# Patient Record
Sex: Female | Born: 1980 | Race: White | Hispanic: No | Marital: Single | Smoking: Never smoker
Health system: Southern US, Community
[De-identification: ages and names within clinical notes are randomized; demographics above are authoritative.]

## PROBLEM LIST (undated history)

## (undated) HISTORY — PX: WISDOM TOOTH EXTRACTION: SHX21

## (undated) HISTORY — PX: LITHOTRIPSY: SUR834

---

## 2003-12-29 ENCOUNTER — Other Ambulatory Visit: Admission: RE | Admit: 2003-12-29 | Discharge: 2003-12-29 | Payer: Self-pay | Admitting: Obstetrics & Gynecology

## 2004-04-25 ENCOUNTER — Ambulatory Visit: Payer: Self-pay | Admitting: Internal Medicine

## 2004-08-14 ENCOUNTER — Ambulatory Visit: Payer: Self-pay | Admitting: Internal Medicine

## 2004-10-15 ENCOUNTER — Ambulatory Visit (HOSPITAL_COMMUNITY): Admission: RE | Admit: 2004-10-15 | Discharge: 2004-10-15 | Payer: Self-pay | Admitting: Urology

## 2004-12-04 ENCOUNTER — Ambulatory Visit: Payer: Self-pay | Admitting: Internal Medicine

## 2004-12-25 ENCOUNTER — Ambulatory Visit: Payer: Self-pay | Admitting: Internal Medicine

## 2005-01-23 ENCOUNTER — Other Ambulatory Visit: Admission: RE | Admit: 2005-01-23 | Discharge: 2005-01-23 | Payer: Self-pay | Admitting: Obstetrics & Gynecology

## 2005-03-25 ENCOUNTER — Ambulatory Visit: Payer: Self-pay | Admitting: Internal Medicine

## 2005-07-12 ENCOUNTER — Ambulatory Visit: Payer: Self-pay | Admitting: Internal Medicine

## 2005-12-03 ENCOUNTER — Ambulatory Visit: Payer: Self-pay | Admitting: Internal Medicine

## 2006-02-04 ENCOUNTER — Ambulatory Visit: Payer: Self-pay | Admitting: Internal Medicine

## 2006-03-25 ENCOUNTER — Ambulatory Visit: Payer: Self-pay | Admitting: Internal Medicine

## 2006-08-06 ENCOUNTER — Ambulatory Visit: Payer: Self-pay | Admitting: Internal Medicine

## 2006-12-01 DIAGNOSIS — J4520 Mild intermittent asthma, uncomplicated: Secondary | ICD-10-CM | POA: Insufficient documentation

## 2006-12-01 DIAGNOSIS — J309 Allergic rhinitis, unspecified: Secondary | ICD-10-CM | POA: Insufficient documentation

## 2007-03-17 ENCOUNTER — Ambulatory Visit: Payer: Self-pay | Admitting: Internal Medicine

## 2007-04-08 ENCOUNTER — Ambulatory Visit: Payer: Self-pay | Admitting: Internal Medicine

## 2007-11-02 ENCOUNTER — Ambulatory Visit: Payer: Self-pay | Admitting: Internal Medicine

## 2008-05-04 ENCOUNTER — Ambulatory Visit: Payer: Self-pay | Admitting: Internal Medicine

## 2008-05-31 ENCOUNTER — Ambulatory Visit: Payer: Self-pay | Admitting: Internal Medicine

## 2008-06-06 ENCOUNTER — Telehealth (INDEPENDENT_AMBULATORY_CARE_PROVIDER_SITE_OTHER): Payer: Self-pay | Admitting: *Deleted

## 2009-03-09 ENCOUNTER — Ambulatory Visit: Payer: Self-pay | Admitting: Internal Medicine

## 2009-05-29 ENCOUNTER — Ambulatory Visit: Payer: Self-pay | Admitting: Internal Medicine

## 2010-03-20 NOTE — Assessment & Plan Note (Signed)
Summary: 1 year return/mh   Primary Provider/Referring Provider:  Alysia Penna  CC:  Yearly follow up visit-doing great.Marland Kitchen  History of Present Illness: 03/17/07-f Present Illness: Allergic rhinitis Asthma On allergy vaccine 1:10 ( W-E) but misses/ delays occ on trips with work with no effect. We discussed stopping vaccine and length of time off vaccine for observation Denies ears popping, nasal discharge, wheeze or cough. Discussed otc antihistamines. Diiscussed environmental precautions.  05/31/08- Allergic rhinitis, asthma Had no problems this winter. This is the first spring without  problems and she feels very well now. Can't remember the last time she needed her rescue inhaler. Not needing an antihistamine. She decided to continue allergy vaccine last year.   May 29, 2009- Allergic rhinitis, asthma Another good year. She hasn't used her rescue inhaler at alll in the past year. She has continued allergy vaccine but we talked about coming off now. Discussed available antihistamines if needed. She continues Singulair, and Advair once daily.     Current Medications (verified): 1)  Advair Diskus 100-50 Mcg/dose  Misc (Fluticasone-Salmeterol) .... Use Every Am 2)  Singulair 10 Mg  Tabs (Montelukast Sodium) .... Take 1 Tablet By Mouth Once A Day 3)  Allergy Vaccine Go 1:10 (W-E) 4)  Epipen 2-Pak 0.3 Mg/0.70ml (1:1000)  Devi (Epinephrine Hcl (Anaphylaxis)) .... For Severe Allergic Reaction 5)  Proair Hfa 108 (90 Base) Mcg/act Aers (Albuterol Sulfate) .... 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   Risk Factors: Smoking Status: never (03/17/2007)  Review of Systems      See HPI  The patient denies anorexia, fever, weight loss, weight gain, vision loss, decreased hearing, hoarseness, chest pain, syncope, dyspnea on exertion, peripheral edema, prolonged cough, headaches, hemoptysis, and severe indigestion/heartburn.    Vital Signs:  Patient profile:   30 year old female Height:      69 inches Weight:      288 pounds BMI:     42.68 O2 Sat:      97 % on Room air Pulse rate:   81 / minute BP sitting:   122 / 80  (left arm) Cuff size:   large  Vitals Entered By: Reynaldo Minium CMA (May 29, 2009 4:06 PM)  O2 Flow:  Room air  Physical Exam  Additional Exam:  General: A/Ox3; pleasant and cooperative, NAD,  remains significantly overweight SKIN: no rash, lesions NODES: no lymphadenopathy HEENT: Tropic/AT, EOM- WNL, Conjuctivae- clear, PERRLA, TM-WNL, Nose- clear, Throat- clear and wnl, Mallampati III-IV, tonsils present NECK: Supple w/ fair ROM, JVD- none, normal carotid impulses w/o bruits Thyroid- normal to palpation, no stridor CHEST: Clear to P&A HEART: RRR, no m/g/r heard ABDOMEN:  EAV:WUJW, nl pulses, no edema  NEURO: Grossly intact to observation      Impression & Recommendations:  Problem # 1:  ASTHMA (ICD-493.90) She has done very well on maintenance meds which we are renewing.  Problem # 2:  ALLERGIC RHINITIS (ICD-477.9)  She has been extrenmely well controlled over past few years. OTC antihistamines are available. We will stop allergy vaccine now.  Other Orders: Est. Patient Level III (11914)  Patient Instructions:  1)  Please schedule a follow-up appointment in 1 year. 2)  Scripts for refill meds sent to your drug store 3)  We are stopping allergy vaccine.  4)  Consider an otc antihistamine like claritin/ loratadine if needed for itching/ sneezing Prescriptions: PROAIR HFA 108 (90 BASE)  MCG/ACT AERS (ALBUTEROL SULFATE) 2 puffs four times a day as needed rescue inhaler  #1 x prn   Entered and Authorized by:   Waymon Budge MD   Signed by:   Waymon Budge MD on 05/29/2009   Method used:   Electronically to        [id number] (retail)       [address]       [place], [place]  [address]       Ph: [phone] or [phone]       Fax: [phone]   RxID:   [id number] SINGULAIR 10 MG  TABS (MONTELUKAST SODIUM) Take 1 tablet by mouth once a day  #30 x prn   Entered and Authorized by:   Waymon Budge MD   Signed by:   Waymon Budge MD on 05/29/2009   Method used:   Electronically to        [id number] (retail)       [address]       [place], [place]  [address]       Ph: [phone] or [phone]       Fax: [phone]   RxID:   [id number] ADVAIR DISKUS 100-50 MCG/DOSE  MISC (FLUTICASONE-SALMETEROL) use every am  #60 Each x prn   Entered and Authorized by:   Waymon Budge MD   Signed by:   Waymon Budge MD on 05/29/2009   Method used:   Electronically to        [id number] (retail)       [address]       [place], [place]  [address]       Ph: [phone] or [phone]       Fax: [phone]   RxID:   [id number]

## 2010-05-23 ENCOUNTER — Encounter: Payer: Self-pay | Admitting: Internal Medicine

## 2010-05-28 ENCOUNTER — Ambulatory Visit: Payer: Self-pay | Admitting: Internal Medicine

## 2010-07-06 NOTE — Assessment & Plan Note (Signed)
[facility]                             PULMONARY OFFICE NOTE   DENNICE, TINDOL                     MRN:          [id number]  DATE:02/04/2006                            DOB:          1980-05-24    PROBLEM:  1. Allergic rhinitis  2. Asthma.  3. Exogenous obesity.   HISTORY:  One-year follow-up.  She says it has been a good fall and  overall the best year in a long time for her rhinitis complaints.  A  neighbor gives her allergy shots at 1-10, well-tolerated.  We took time  again today to discuss risks, benefit, and goals of allergy vaccine,  including issues of administration outside the medical office,  anaphylaxis, and epinephrine, and I refilled an Epi-Pen with discussion.   MEDICATIONS:  1. Advair 100/50.  2. Birth control pills.  3. Allergy vaccine.  4. Singulair.  5. Astelin nasal spray.  6. Rescue albuterol inhaler.   ALLERGIES:  No medication allergy.   OBJECTIVE:  VITAL SIGNS:  Weight exceeds our scale limit.  Blood  pressure 128/82, pulse regular and 113, room air saturation 98%.  GENERAL:  Alert.  HEENT:  Conjunctivae not injected.  Nasal mucosa looks normal.  LUNGS:  Clear.  HEART:  Heart sounds regular without murmur.   IMPRESSION:  1. Allergic rhinitis.  2. Allergic asthma under good control.   PLAN:  We refilled Advair 100/50, albuterol HFA, and Allegra 180 mg,  Astelin, and Epi-Pen.  Schedule return one year, earlier p.r.n.     Clinton D. Maple Hudson, MD, Tonny Bollman, FACP  Electronically Signed    CDY/MedQ  DD: 02/05/2006  DT: 02/05/2006  Job #: [id number]

## 2010-07-09 ENCOUNTER — Ambulatory Visit: Payer: Self-pay | Admitting: Internal Medicine

## 2010-08-20 ENCOUNTER — Encounter: Payer: Self-pay | Admitting: Internal Medicine

## 2010-08-20 ENCOUNTER — Ambulatory Visit (INDEPENDENT_AMBULATORY_CARE_PROVIDER_SITE_OTHER): Payer: BC Managed Care – PPO | Admitting: Internal Medicine

## 2010-08-20 VITALS — BP 140/86 | HR 84 | Ht 69.0 in | Wt 312.0 lb

## 2010-08-20 DIAGNOSIS — J309 Allergic rhinitis, unspecified: Secondary | ICD-10-CM

## 2010-08-20 DIAGNOSIS — J45909 Unspecified asthma, uncomplicated: Secondary | ICD-10-CM

## 2010-08-20 MED ORDER — FLUTICASONE-SALMETEROL 100-50 MCG/DOSE IN AEPB: 1.0000 | INHALATION_SPRAY | Freq: Two times a day (BID) | RESPIRATORY_TRACT | Status: DC

## 2010-08-20 MED ORDER — ALBUTEROL SULFATE HFA 108 (90 BASE) MCG/ACT IN AERS: 2.0000 | INHALATION_SPRAY | Freq: Four times a day (QID) | RESPIRATORY_TRACT | Status: DC | PRN

## 2010-08-20 NOTE — Assessment & Plan Note (Signed)
Sudden onset nasal congestion with sneeze, lasting a day or so, sounds more like a vasomotor rhinitis. I will give a sample of Patanase since I have it here. Consider trying ipratropium.

## 2010-08-20 NOTE — Progress Notes (Signed)
  Subjective:    Patient ID: Rebecca Irwin, female    DOB: 10-21-1980, 30 y.o.   MRN: [id number]  HPI 08/20/10- 16 yoF never smoker followed for asthma, allergic rhinitis Last here May 29, 2009 Cites one day or so per month, especially in Spring, when she starts active sneezing. An ice pack will calm this. No particular exposure. She stoppd allergy shots after 5 years, 2 years ago. She believes they stopped her asthma. She has been taking Advair once daily and begins to cough more if she skips it. Stopped Singulair- little benefit. . Takes zyrtec. Never refilled rescue inhalers. A nose spray tried in the past had an unpleasant metallic taste = Astelin?- she calls it flonase..    Review of Systems Constitutional:   No weight loss, night sweats,  Fevers, chills, fatigue, lassitude. HEENT:   No headaches,  Difficulty swallowing,  Tooth/dental problems,  Sore throat,                CV:  No chest pain,  Orthopnea, PND, swelling in lower extremities, anasarca, dizziness, palpitations  GI  No heartburn, indigestion, abdominal pain, nausea, vomiting, diarrhea, change in bowel habits, loss of appetite  Resp: No shortness of breath with exertion or at rest.  No excess mucus, no productive cough,  No non-productive cough,  No coughing up of blood.  No change in color of mucus.  No recent wheezing.   Skin: no rash or lesions.  GU: no dysuria, change in color of urine, no urgency or frequency.  No flank pain.  MS:  No joint pain or swelling.  No decreased range of motion.  No back pain.  Psych:  No change in mood or affect. No depression or anxiety.  No memory loss.     Objective:   Physical Exam General- Alert, Oriented, Affect-appropriate, Distress- none acute    obese  Skin- rash-none, lesions- none, excoriation- none  Lymphadenopathy- none  Head- atraumatic  Eyes- Gross vision intact, PERRLA, conjunctivae clear secretions  Ears- Hearing, canals, Tm- normal  Nose- Clear, No-Septal  dev,   Some- mucus bridging, No- polyps, erosion, perforation   Throat- Mallampati II-III , mucosa clear , drainage- none, tonsils- moderately large, not occlusive  Neck- flexible , trachea midline, no stridor , thyroid nl, carotid no bruit  Chest - symmetrical excursion , unlabored     Heart/CV- RRR , no murmur , no gallop  , no rub, nl s1 s2                     - JVD- none , edema- none, stasis changes- none, varices- none     Lung- clear to P&A, wheeze- none, cough- none , dullness-none, rub- none     Chest wall-   Abd- tender-no, distended-no, bowel sounds-present, HSM- no  Br/ Gen/ Rectal- Not done, not indicated  Extrem- cyanosis- none, clubbing, none, atrophy- none, strength- nl  Neuro- grossly intact to observation         Assessment & Plan:

## 2010-08-20 NOTE — Patient Instructions (Signed)
Script Advair refilled- sent  Script Proair rescue inhaler printed  Singulair dc'd  Sample Patanase nasal antihistamine spray      1-2 puffs each nostril, up to twice daily as needed

## 2010-08-20 NOTE — Assessment & Plan Note (Addendum)
Good control.  Will remove Singulair from med list, refill Advair and rescue inhaler

## 2011-08-19 ENCOUNTER — Ambulatory Visit: Payer: BC Managed Care – PPO | Admitting: Internal Medicine

## 2011-10-14 ENCOUNTER — Ambulatory Visit: Payer: BC Managed Care – PPO | Admitting: Internal Medicine

## 2011-12-10 ENCOUNTER — Ambulatory Visit (INDEPENDENT_AMBULATORY_CARE_PROVIDER_SITE_OTHER): Payer: BC Managed Care – PPO | Admitting: Internal Medicine

## 2011-12-10 ENCOUNTER — Encounter: Payer: Self-pay | Admitting: Internal Medicine

## 2011-12-10 VITALS — BP 128/86 | HR 82 | Ht 69.0 in | Wt 341.2 lb

## 2011-12-10 DIAGNOSIS — J4520 Mild intermittent asthma, uncomplicated: Secondary | ICD-10-CM

## 2011-12-10 DIAGNOSIS — J45909 Unspecified asthma, uncomplicated: Secondary | ICD-10-CM

## 2011-12-10 MED ORDER — FLUTICASONE-SALMETEROL 100-50 MCG/DOSE IN AEPB: INHALATION_SPRAY | RESPIRATORY_TRACT | Status: DC

## 2011-12-10 MED ORDER — ALBUTEROL SULFATE HFA 108 (90 BASE) MCG/ACT IN AERS: 2.0000 | INHALATION_SPRAY | Freq: Four times a day (QID) | RESPIRATORY_TRACT | Status: AC | PRN

## 2011-12-10 NOTE — Progress Notes (Signed)
Subjective:    Patient ID: Rebecca Irwin, female    DOB: 03/04/1980, 31 y.o.   MRN: [id number]  HPI 08/20/10- 28 yoF never smoker followed for asthma, allergic rhinitis Last here May 29, 2009 Cites one day or so per month, especially in Spring, when she starts active sneezing. An ice pack will calm this. No particular exposure. She stoppd allergy shots after 5 years, 2 years ago. She believes they stopped her asthma. She has been taking Advair once daily and begins to cough more if she skips it. Stopped Singulair- little benefit. . Takes zyrtec. Never refilled rescue inhalers. A nose spray tried in the past had an unpleasant metallic taste = Astelin?- she calls it flonase..   12/10/11- 30 yoF never smoker followed for asthma, allergic rhinitis  Pt states that she has been doing well since last OV with occassional allergy flare. Pt c/o little nasal congestion.  Patient plans to get Flu vax from work Uses Advair irregularly- discussed. Cough in cold air.  Occasional rhinitis- uses zyrtec.   ROS-see HPI Constitutional:   No-   weight loss, night sweats, fevers, chills, fatigue, lassitude. HEENT:   No-  headaches, difficulty swallowing, tooth/dental problems, sore throat,       +occasional  sneezing, itching, ear ache, nasal congestion, post nasal drip,  CV:  No-   chest pain, orthopnea, PND, swelling in lower extremities, anasarca, dizziness, palpitations Resp: No-   shortness of breath with exertion or at rest.              No-   productive cough,  No non-productive cough,  No- coughing up of blood.              No-   change in color of mucus.  No- wheezing.   Skin: No-   rash or lesions. GI:  No-   heartburn, indigestion, abdominal pain, nausea, vomiting,  GU:  MS:  No-   joint pain or swelling.  . Neuro-     nothing unusual Psych:  No- change in mood or affect. No depression or anxiety.  No memory loss.  Objective:   Physical Exam OBJ- Physical Exam General- Alert, Oriented,  Affect-appropriate, Distress- none acute, overweight Skin- rash-none, lesions- none, excoriation- none Lymphadenopathy- none Head- atraumatic            Eyes- Gross vision intact, PERRLA, conjunctivae and secretions clear            Ears- Hearing, canals-normal            Nose- Clear, no-Septal dev, mucus, polyps, erosion, perforation             Throat- Mallampati III , mucosa clear , drainage- none, +tonsils-large, nonocclusive Neck- flexible , trachea midline, no stridor , thyroid nl, carotid no bruit Chest - symmetrical excursion , unlabored           Heart/CV- RRR , no murmur , no gallop  , no rub, nl s1 s2                           - JVD- none , edema- none, stasis changes- none, varices- none           Lung- clear to P&A, wheeze- none, cough- none , dullness-none, rub- none           Chest wall-  Abd-  Br/ Gen/ Rectal- Not done, not indicated Extrem- cyanosis- none, clubbing, none, atrophy- none, strength-  nl Neuro- grossly intact to observation  Assessment & Plan:

## 2011-12-10 NOTE — Patient Instructions (Addendum)
Script for rescue inhaler to carry with you for occasional use as needed  Script for Advair "maintenance inhaler" for use every day during times of need

## 2011-12-20 NOTE — Assessment & Plan Note (Signed)
Has been using Advair as a rescue inhaler. Educated on meds. Plan- Refill Advair 100. Add rescue inhaler

## 2012-12-10 ENCOUNTER — Ambulatory Visit: Payer: BC Managed Care – PPO | Admitting: Internal Medicine

## 2013-03-05 ENCOUNTER — Ambulatory Visit: Payer: BC Managed Care – PPO | Admitting: Internal Medicine

## 2013-04-15 ENCOUNTER — Ambulatory Visit: Payer: BC Managed Care – PPO | Admitting: Internal Medicine

## 2013-05-14 ENCOUNTER — Ambulatory Visit: Payer: BC Managed Care – PPO | Admitting: Internal Medicine

## 2013-06-14 ENCOUNTER — Encounter: Payer: Self-pay | Admitting: Internal Medicine

## 2013-06-14 ENCOUNTER — Ambulatory Visit (INDEPENDENT_AMBULATORY_CARE_PROVIDER_SITE_OTHER): Payer: BC Managed Care – PPO | Admitting: Internal Medicine

## 2013-06-14 VITALS — BP 130/88 | HR 68 | Ht 69.0 in | Wt 341.0 lb

## 2013-06-14 DIAGNOSIS — J45909 Unspecified asthma, uncomplicated: Secondary | ICD-10-CM

## 2013-06-14 DIAGNOSIS — J309 Allergic rhinitis, unspecified: Secondary | ICD-10-CM

## 2013-06-14 DIAGNOSIS — J4520 Mild intermittent asthma, uncomplicated: Secondary | ICD-10-CM

## 2013-06-14 MED ORDER — FLUTICASONE-SALMETEROL 100-50 MCG/DOSE IN AEPB: INHALATION_SPRAY | RESPIRATORY_TRACT | Status: DC

## 2013-06-14 MED ORDER — ALBUTEROL SULFATE HFA 108 (90 BASE) MCG/ACT IN AERS: 2.0000 | INHALATION_SPRAY | Freq: Four times a day (QID) | RESPIRATORY_TRACT | Status: DC | PRN

## 2013-06-14 NOTE — Patient Instructions (Signed)
Refills scripts sent  Please call as needed

## 2013-06-14 NOTE — Progress Notes (Signed)
Subjective:    Patient ID: Rebecca HammingKristin K Irwin, female    DOB: 1980-05-11, 33 y.o.   MRN: [id number]  HPI 08/20/10- [age] yoF never smoker followed for asthma, allergic rhinitis Last here May 29, 2009 Cites one day or so per month, especially in Spring, when she starts active sneezing. An ice pack will calm this. No particular exposure. She stoppd allergy shots after 5 years, 2 years ago. She believes they stopped her asthma. She has been taking Advair once daily and begins to cough more if she skips it. Stopped Singulair- little benefit. . Takes zyrtec. Never refilled rescue inhalers. A nose spray tried in the past had an unpleasant metallic taste = Astelin?- she calls it flonase..   12/10/11- 30 yoF never smoker followed for asthma, allergic rhinitis  Pt states that she has been doing well since last OV with occassional allergy flare. Pt c/o little nasal congestion.  Patient plans to get Flu vax from work Uses Advair irregularly- discussed. Cough in cold air.  Occasional rhinitis- uses zyrtec.   06/15/13- 33 yoF never smoker followed for asthma, allergic rhinitis FOLLOWS FOR:  Sinus and breathing doing well Some nasal congestion with tree pollen season but now doing better. Used Advair for a few weeks. Has moved into in a brand-new house. Working at night. Wheezing stopped when she replaced brand-new mattress with an older one.  ROS-see HPI Constitutional:   No-   weight loss, night sweats, fevers, chills, fatigue, lassitude. HEENT:   No-  headaches, difficulty swallowing, tooth/dental problems, sore throat,       +occasional  sneezing, itching, ear ache, nasal congestion, post nasal drip,  CV:  No-   chest pain, orthopnea, PND, swelling in lower extremities, anasarca, dizziness, palpitations Resp: No-   shortness of breath with exertion or at rest.              No-   productive cough,  No non-productive cough,  No- coughing up of blood.              No-   change in color of mucus.  No-  wheezing.   Skin: No-   rash or lesions. GI:  No-   heartburn, indigestion, abdominal pain, nausea, vomiting,  GU:  MS:  No-   joint pain or swelling.  . Neuro-     nothing unusual Psych:  No- change in mood or affect. No depression or anxiety.  No memory loss.  Objective:   Physical Exam OBJ- Physical Exam General- Alert, Oriented, Affect-appropriate, Distress- none acute, overweight Skin- rash-none, lesions- none, excoriation- none Lymphadenopathy- none Head- atraumatic            Eyes- Gross vision intact, PERRLA, conjunctivae and secretions clear            Ears- Hearing, canals-normal            Nose- Clear, no-Septal dev, mucus, polyps, erosion, perforation             Throat- Mallampati III , mucosa clear , drainage- none, +tonsils-large, nonocclusive Neck- flexible , trachea midline, no stridor , thyroid nl, carotid no bruit Chest - symmetrical excursion , unlabored           Heart/CV- RRR , no murmur , no gallop  , no rub, nl s1 s2                           - JVD- none , edema- none,  stasis changes- none, varices- none           Lung- clear to P&A, wheeze- none, cough- none , dullness-none, rub- none           Chest wall-  Abd-  Br/ Gen/ Rectal- Not done, not indicated Extrem- cyanosis- none, clubbing, none, atrophy- none, strength- nl Neuro- grossly intact to observation  Assessment & Plan:

## 2013-07-09 NOTE — Assessment & Plan Note (Signed)
Recent flare attributed to tree pollens, now resolved

## 2013-07-09 NOTE — Assessment & Plan Note (Signed)
Mild exacerbation apparently related to a new mattress, resolved by returning to an older one

## 2014-06-13 ENCOUNTER — Ambulatory Visit: Payer: BC Managed Care – PPO | Admitting: Internal Medicine

## 2015-09-21 DIAGNOSIS — K219 Gastro-esophageal reflux disease without esophagitis: Secondary | ICD-10-CM | POA: Insufficient documentation

## 2018-01-22 ENCOUNTER — Ambulatory Visit: Payer: BC Managed Care – PPO | Admitting: Family Medicine

## 2018-01-22 ENCOUNTER — Encounter: Payer: Self-pay | Admitting: Family Medicine

## 2018-01-22 ENCOUNTER — Ambulatory Visit (INDEPENDENT_AMBULATORY_CARE_PROVIDER_SITE_OTHER): Payer: BC Managed Care – PPO

## 2018-01-22 VITALS — BP 150/100 | HR 101 | Temp 98.1°F | Ht 69.0 in | Wt 368.4 lb

## 2018-01-22 DIAGNOSIS — R058 Other specified cough: Secondary | ICD-10-CM | POA: Insufficient documentation

## 2018-01-22 DIAGNOSIS — I10 Essential (primary) hypertension: Secondary | ICD-10-CM

## 2018-01-22 DIAGNOSIS — R0683 Snoring: Secondary | ICD-10-CM | POA: Diagnosis not present

## 2018-01-22 DIAGNOSIS — R5383 Other fatigue: Secondary | ICD-10-CM

## 2018-01-22 DIAGNOSIS — R059 Cough, unspecified: Secondary | ICD-10-CM

## 2018-01-22 DIAGNOSIS — R0789 Other chest pain: Secondary | ICD-10-CM | POA: Diagnosis not present

## 2018-01-22 DIAGNOSIS — R05 Cough: Secondary | ICD-10-CM | POA: Diagnosis not present

## 2018-01-22 LAB — CBC
HEMATOCRIT: 40.5 % (ref 36.0–46.0)
Hemoglobin: 13.2 g/dL (ref 12.0–15.0)
MCHC: 32.6 g/dL (ref 30.0–36.0)
MCV: 90.5 fl (ref 78.0–100.0)
Platelets: 256 10*3/uL (ref 150.0–400.0)
RBC: 4.47 Mil/uL (ref 3.87–5.11)
RDW: 13.6 % (ref 11.5–15.5)
WBC: 6.3 10*3/uL (ref 4.0–10.5)

## 2018-01-22 LAB — BASIC METABOLIC PANEL
BUN: 17 mg/dL (ref 6–23)
CALCIUM: 8.9 mg/dL (ref 8.4–10.5)
CHLORIDE: 106 meq/L (ref 96–112)
CO2: 29 meq/L (ref 19–32)
CREATININE: 0.77 mg/dL (ref 0.40–1.20)
GFR: 88.41 mL/min (ref >60.00)
GLUCOSE: 114 mg/dL — AB (ref 70–99)
Potassium: 4.8 mEq/L (ref 3.5–5.1)
Sodium: 140 mEq/L (ref 135–145)

## 2018-01-22 MED ORDER — PREDNISONE 10 MG (21) PO TBPK: ORAL_TABLET | ORAL | 0 refills | Status: DC

## 2018-01-22 MED ORDER — BENZONATATE 100 MG PO CAPS: 100.0000 mg | ORAL_CAPSULE | Freq: Three times a day (TID) | ORAL | 0 refills | Status: DC | PRN

## 2018-01-22 MED ORDER — CHLORTHALIDONE 25 MG PO TABS: 12.5000 mg | ORAL_TABLET | Freq: Every day | ORAL | 1 refills | Status: DC

## 2018-01-22 NOTE — Progress Notes (Signed)
Rebecca Irwin Primo - 37 y.o. female MRN [id number]  Date of birth: [birth year]  Subjective Chief Complaint  Patient presents with  . Cough    last week oct   . Hypertension    HPI Rebecca Irwin Supple is a 37 y.o. female with history of allergies and asthma here today to establish care with new pcp.  She has concern about ongoing cough.  She reports that symptoms started >1 month ago when she developed cough with wheezing and shortness of breath.  She was seen at urgent care and treated with albuterol and steroid.  She had improvement for about two weeks before symptoms returned.  She has felt associated heaviness in her chest as well.  She hasn't noticed a whole lot of wheezing with this.  She denies fever, chills, pleuritic type chest pain, nausea or vomiting, sinus pain or drainage.    BP also noted to be elevated today.  She has no prior history of HTN and does not take any medication for blood pressure.  She denies any decongestant use.  She does admit to some fatigue but thinks  This may be related to cough.  She does snore pretty heavily with sleep.    ROS:  A comprehensive ROS was completed and negative except as noted per HPI  No Known Allergies  Past Medical History:  Diagnosis Date  . ALLERGIC RHINITIS    skin test positive 2004  . Asthma     Past Surgical History:  Procedure Laterality Date  . LITHOTRIPSY     kidney stones  . WISDOM TOOTH EXTRACTION      Social History   Socioeconomic History  . Marital status: Single    Spouse name: Not on file  . Number of children: Not on file  . Years of education: Not on file  . Highest education level: Not on file  Occupational History  . Occupation: works with Primary school teacherT    Employer: [employer]  Social Needs  . Financial resource strain: Not on file  . Food insecurity:    Worry: Not on file    Inability: Not on file  . Transportation needs:    Medical: Not on file    Non-medical: Not on file  Tobacco Use  . Smoking  status: Never Smoker  . Smokeless tobacco: Never Used  Substance and Sexual Activity  . Alcohol use: Yes    Comment: occass.  . Drug use: Never  . Sexual activity: Not on file  Lifestyle  . Physical activity:    Days per week: Not on file    Minutes per session: Not on file  . Stress: Not on file  Relationships  . Social connections:    Talks on phone: Not on file    Gets together: Not on file    Attends religious service: Not on file    Active member of club or organization: Not on file    Attends meetings of clubs or organizations: Not on file    Relationship status: Not on file  Other Topics Concern  . Not on file  Social History Narrative  . Not on file    Family History  Problem Relation Age of Onset  . Allergies Father        on allergy shots  . Diabetes Maternal Grandmother   . Diabetes Maternal Grandfather   . Cancer Maternal Grandfather   . Pancreatic cancer Maternal Grandfather   . Cancer Paternal Grandmother   . Breast cancer  Paternal Grandmother   . Cancer Paternal Grandfather   . Lung cancer Paternal Grandfather     Health Maintenance  Topic Date Due  . Janet Berlin  06/02/1997  . PAP SMEAR  06/03/1999  . HIV Screening  01/23/2019 (Originally 06/02/1993)  . INFLUENZA VACCINE  02/04/2019 (Originally 09/18/2017)    ----------------------------------------------------------------------------------------------------------------------------------------------------------------------------------------------------------------- Physical Exam BP (!) 150/100   Pulse (!) 101   Temp 98.1 F (36.7 C) (Oral)   Ht 5\' 9"  (1.753 m)   Wt (!) 368 lb 6.4 oz (167.1 kg)   LMP 01/13/2018 (Exact Date)   SpO2 98%   BMI 54.40 kg/m   Physical Exam  Constitutional: She is oriented to person, place, and time. She appears well-nourished. No distress.  HENT:  Head: Normocephalic and atraumatic.  Mouth/Throat: Oropharynx is clear and moist.  Eyes: No scleral icterus.    Neck: Neck supple.  Cardiovascular: Normal rate, regular rhythm and normal heart sounds.  Pulmonary/Chest: Effort normal and breath sounds normal.  Lymphadenopathy:    She has no cervical adenopathy.  Neurological: She is alert and oriented to person, place, and time.  Skin: Skin is warm and dry.  Psychiatric: She has a normal mood and affect. Her behavior is normal.    ------------------------------------------------------------------------------------------------------------------------------------------------------------------------------------------------------------------- Essential hypertension -Start chlorthalidone 12.5mg  -Follow low salt diet -Referral for sleep study.  -F/u 6 weeks.   Cough -Likely post viral cough  -EKG completed today for chest heaviness and normal without ST changes or signs of pericarditis.  -CXR ordered -Repeat prednisone taper.  -Tessalon as needed.  -Recommend humidifier at home.

## 2018-01-22 NOTE — Assessment & Plan Note (Signed)
-  Start chlorthalidone 12.5mg  -Follow low salt diet -Referral for sleep study.  -F/u 6 weeks.

## 2018-01-22 NOTE — Assessment & Plan Note (Addendum)
-  Likely post viral cough  -EKG completed today for chest heaviness and normal without ST changes or signs of pericarditis.  -CXR ordered -Repeat prednisone taper.  -Tessalon as needed.  -Recommend humidifier at home.

## 2018-01-22 NOTE — Patient Instructions (Signed)
We'll be in touch with xray and lab results I have placed a referral for sleep study I will see you back in about 6 weeks.

## 2018-01-23 NOTE — Progress Notes (Signed)
-  CXR looks ok.  Radiologist did note area adjacent to the heart that appears to be a shadow of the heart and is unchanged when compared to imaging in 2010.   -Labs look ok as well.

## 2018-02-05 ENCOUNTER — Encounter: Payer: Self-pay | Admitting: Family Medicine

## 2018-02-05 DIAGNOSIS — R0789 Other chest pain: Secondary | ICD-10-CM

## 2018-02-24 ENCOUNTER — Encounter: Payer: Self-pay | Admitting: Internal Medicine

## 2018-02-24 ENCOUNTER — Ambulatory Visit (INDEPENDENT_AMBULATORY_CARE_PROVIDER_SITE_OTHER): Payer: BC Managed Care – PPO | Admitting: Internal Medicine

## 2018-02-24 VITALS — BP 124/84 | HR 102 | Ht 69.0 in | Wt 361.0 lb

## 2018-02-24 DIAGNOSIS — R05 Cough: Secondary | ICD-10-CM

## 2018-02-24 DIAGNOSIS — R058 Other specified cough: Secondary | ICD-10-CM

## 2018-02-24 MED ORDER — BENZONATATE 200 MG PO CAPS: 200.0000 mg | ORAL_CAPSULE | Freq: Three times a day (TID) | ORAL | 1 refills | Status: DC | PRN

## 2018-02-24 MED ORDER — PREDNISONE 10 MG PO TABS: ORAL_TABLET | ORAL | 0 refills | Status: DC

## 2018-02-24 MED ORDER — FAMOTIDINE 20 MG PO TABS: ORAL_TABLET | ORAL | 11 refills | Status: DC

## 2018-02-24 MED ORDER — PANTOPRAZOLE SODIUM 40 MG PO TBEC: 40.0000 mg | DELAYED_RELEASE_TABLET | Freq: Every day | ORAL | 2 refills | Status: DC

## 2018-02-24 NOTE — Assessment & Plan Note (Signed)
Body mass index is 53.31 kg/m.    No results found for: TSH   Contributing to gerd risk/ doe/reviewed the need and the process to achieve and maintain neg calorie balance > defer f/u primary care including intermittently monitoring thyroid status       Total time devoted to counseling  > 50 % of initial 60 min office visit:  review case with pt/ discussion of options/alternatives/ personally creating written customized instructions  in presence of pt  then going over those specific  Instructions directly with the pt including how to use all of the meds but in particular covering each new medication in detail and the difference between the maintenance= "automatic" meds and the prns using an action plan format for the latter (If this problem/symptom => do that organization reading Left to right).  Please see AVS from this visit for a full list of these instructions which I personally wrote for this pt and  are unique to this visit.

## 2018-02-24 NOTE — Patient Instructions (Signed)
Stop advair   The key to effective treatment for your cough is eliminating the non-stop cycle of cough you're stuck in long enough to let your airway heal completely and then see if there is anything still making you cough once you stop the cough suppression, but this should take no more than 5 days to figure out  For cough take tessalon 200 mg every 6 hours with delsym otc if needed (ok to use both if can't stop)  Prednisone 10 mg take  4 each am x 2 days,   2 each am x 2 days,  1 each am x 2 days and stop (this is to eliminate allergies and inflammation from coughing)  Protonix (pantoprazole) Take 30-60 min before first meal of the day and Pepcid 20 mg one after supper     until cough is completely gone for at least a week without the need for cough suppression  For drainage / throat tickle try take CHLORPHENIRAMINE  4 mg (chlortab 4 mg per [facility])  - take one every 4 hours as needed - available over the counter- may cause drowsiness so start with just a bedtime dose or two and see how you tolerate it before trying in daytime    GERD (REFLUX)  is an extremely common cause of respiratory symptoms, many times with no significant heartburn at all.    It can be treated with medication, but also with lifestyle changes including avoidance of late meals, excessive alcohol, smoking cessation, and avoid fatty foods, chocolate, peppermint, colas, red wine, and acidic juices such as orange juice.  NO MINT OR MENTHOL PRODUCTS SO NO COUGH DROPS   USE HARD CANDY INSTEAD (jolley ranchers or Stover's or Lifesavers (all available in sugarless versions) NO OIL BASED VITAMINS - use powdered substitutes.  If not all better in 2 weeks return for further work up

## 2018-02-24 NOTE — Assessment & Plan Note (Addendum)
Onset Oct 2019 with [facility] rx advair > d/c advair 02/24/2018   The most common causes of chronic cough in immunocompetent adults include the following: upper airway cough syndrome (UACS), previously referred to as postnasal drip syndrome (PNDS), which is caused by variety of rhinosinus conditions; (2) asthma; (3) GERD; (4) chronic bronchitis from cigarette smoking or other inhaled environmental irritants; (5) nonasthmatic eosinophilic bronchitis; and (6) bronchiectasis.   These conditions, singly or in combination, have accounted for up to 94% of the causes of chronic cough in prospective studies.   Other conditions have constituted no >6% of the causes in prospective studies These have included bronchogenic carcinoma, chronic interstitial pneumonia, sarcoidosis, left ventricular failure, ACEI-induced cough, and aspiration from a condition associated with pharyngeal dysfunction.    Chronic cough is often simultaneously caused by more than one condition. A single cause has been found from 38 to 82% of the time, multiple causes from 18 to 62%. Multiply caused cough has been the result of three diseases up to 42% of the time.      Since not having typical allergy symtpoms with this cough or noct cough most likely this is Upper airway cough syndrome (previously labeled PNDS),  is so named because it's frequently impossible to sort out how much is  CR/sinusitis with freq throat clearing (which can be related to primary GERD)   vs  causing  secondary (" extra esophageal")  GERD from wide swings in gastric pressure that occur with throat clearing, often  promoting self use of mint and menthol lozenges that reduce the lower esophageal sphincter tone and exacerbate the problem further in a cyclical fashion.   These are the same pts (now being labeled as having "irritable larynx syndrome" by some cough centers) who not infrequently have a history of having failed to tolerate ace inhibitors,  dry powder inhalers like  advair  or biphosphonates or report having atypical/extraesophageal reflux symptoms that don't respond to standard doses of PPI  and are easily confused as having aecopd or asthma flares by even experienced allergists/ pulmonologists (myself included).    Of the three most common causes of  Sub-acute / recurrent or chronic cough, only one (GERD)  can actually contribute to/ trigger  the other two (asthma and post nasal drip syndrome)  and perpetuate the cylce of cough.  While not intuitively obvious, many patients with chronic low grade reflux do not cough until there is a primary insult that disturbs the protective epithelial barrier and exposes sensitive nerve endings.   This is typically viral but can due to PNDS and  either may apply here.   >>>>    The point is that once this occurs, it is difficult to eliminate the cycle  using anything but a maximally effective acid suppression regimen at least in the short run, accompanied by an appropriate diet to address non acid GERD and control / eliminate the cough itself for at least 3 days with tessalon 200 and delsym and avoid narcotics for now plus 1st gen H1 blockers per guidelines and short course pred for any upper airway T2 driven inflammatory component  If  not better in 2 weeks return with all meds in hand using a trust but verify approach to confirm accurate Medication  Reconciliation The principal here is that until we are certain that the  patients are doing what we've asked, it makes no sense to ask them to do more.

## 2018-02-24 NOTE — Progress Notes (Signed)
Rebecca HammingKristin K Irwin, female    DOB: 01/20/1981,     MRN: [id number]    Brief patient profile:  [id number] yowf never smoker with early in life itchy/sneezing spring > fall  Initially controlled with otcs but then  Onset worse rhinitis assoc cough/wheeze late teens/early twenties so eventually eval by [facility] started  On shots until around 2015 and since then able to control with otc's (benadryl/ no inhalers)  and doing fine until bad sore throat [place] 2019 assoc with severe urge to cough/ clear throat > rx by [facility] with pred/cough med and advair was restarted (p 4 y off ) and did get some short term improvement from 2 cycles of pred but worse off it so referred to pulmonary clinic 02/24/2018 by Everrett Coombeody Matthews.     Brief patient profile:  02/24/2018  Pulmonary/ 1st office eval/[facility]  Cough on advair 100  Chief Complaint  Patient presents with  . Pulmonary Consult    Referred by Everrett Coombeody Matthews, DO.  Pt c/o cough x 3 months- non prod.  She has noticed her cough is triggered by exertion and talking.   Dyspnea:  Only sob if coughing  Cough: no production/ worse as day goes on esp when uses voice  Sleep: fine bed is flat s noct coughing or sob or wheeze SABA use:   None    No obvious other obvious pattern in terms of  day to day or daytime variability or assoc excess/ purulent sputum or mucus plugs or hemoptysis or cp or chest tightness, subjective wheeze or overt sinus or hb symptoms.   Sleeping  without nocturnal  or early am exacerbation  of respiratory  c/o's or need for noct saba. Also denies any obvious fluctuation of symptoms with weather or environmental changes or other aggravating or alleviating factors except as outlined above   No unusual exposure hx or h/o childhood pna  or knowledge of premature birth.  Current Allergies, Complete Past Medical History, Past Surgical History, Family History, and Social History were reviewed in [facility] electronic medical  record.  ROS  The following are not active complaints unless bolded Hoarseness, sore throat, dysphagia, dental problems, itching, sneezing,  nasal congestion or discharge of excess mucus or purulent secretions, ear ache,   fever, chills, sweats, unintended wt loss or wt gain, classically pleuritic or exertional cp,  orthopnea pnd or arm/hand swelling  or leg swelling, presyncope, palpitations, abdominal pain, anorexia, nausea, vomiting, diarrhea  or change in bowel habits or change in bladder habits, change in stools or change in urine, dysuria, hematuria,  rash, arthralgias, visual complaints, headache, numbness, weakness or ataxia or problems with walking or coordination,  change in mood or  memory.          Past Medical History:  Diagnosis Date  . ALLERGIC RHINITIS    skin test positive 2004  . Asthma     Outpatient Medications Prior to Visit  Medication Sig Dispense Refill  . benzonatate (TESSALON) 100 MG capsule Take 1 capsule (100 mg total) by mouth 3 (three) times daily as needed for cough. 30 capsule 0  . chlorthalidone (HYGROTON) 25 MG tablet Take 0.5 tablets (12.5 mg total) by mouth daily. 45 tablet 1  . Fluticasone-Salmeterol (ADVAIR) 100-50 MCG/DOSE AEPB 1 puff twice daily , rinse mouth 60 each prn  . predniSONE (STERAPRED UNI-PAK 21 TAB) 10 MG (21) TBPK tablet Take as directed on packaging 21 tablet 0      Objective:  BP 124/84 (BP Location: Left Arm, Cuff Size: Large)   Pulse (!) 102   Ht 5\' 9"  (1.753 m)   Wt (!) 361 lb (163.7 kg)   SpO2 98%   BMI 53.31 kg/m   SpO2: 98 %  RA  Obese amb wf with voice fatigue   HEENT: nl dentition, turbinates bilaterally, and oropharynx. Nl external ear canals without cough reflex   NECK :  without JVD/Nodes/TM/ nl carotid upstrokes bilaterally   LUNGS: no acc muscle use,  Nl contour chest which is clear to A and P bilaterally with  cough on insp  maneuvers   CV:  RRR  no s3 or murmur or increase in P2, and no edema    ABD:  Quite oberse/ soft and nontender with nl inspiratory excursion in the supine position. No bruits or organomegaly appreciated, bowel sounds nl  MS:  Nl gait/ ext warm without deformities, calf tenderness, cyanosis or clubbing No obvious joint restrictions   SKIN: warm and dry without lesions    NEURO:  alert, approp, nl sensorium with  no motor or cerebellar deficits apparent.      I personally reviewed images and agree with radiology impression as follows:  CXR:   02/01/18 Opacity to the right of the heart is stable since 2010 and of doubtful acute significance. This could represent part of the heart shadow, cardiomegaly. The finding is of doubtful significance given the lack of change since 2010    Assessment   Upper airway cough syndrome Onset Oct 2019 with [facility] rx advair > d/c advair 02/24/2018    The most common causes of chronic cough in immunocompetent adults include the following: upper airway cough syndrome (UACS), previously referred to as postnasal drip syndrome (PNDS), which is caused by variety of rhinosinus conditions; (2) asthma; (3) GERD; (4) chronic bronchitis from cigarette smoking or other inhaled environmental irritants; (5) nonasthmatic eosinophilic bronchitis; and (6) bronchiectasis.   These conditions, singly or in combination, have accounted for up to 94% of the causes of chronic cough in prospective studies.   Other conditions have constituted no >6% of the causes in prospective studies These have included bronchogenic carcinoma, chronic interstitial pneumonia, sarcoidosis, left ventricular failure, ACEI-induced cough, and aspiration from a condition associated with pharyngeal dysfunction.    Chronic cough is often simultaneously caused by more than one condition. A single cause has been found from 38 to 82% of the time, multiple causes from 18 to 62%. Multiply caused cough has been the result of three diseases up to 42% of the time.      Since not  having typical allergy symtpoms with this cough or noct cough most likely this is Upper airway cough syndrome (previously labeled PNDS),  is so named because it's frequently impossible to sort out how much is  CR/sinusitis with freq throat clearing (which can be related to primary GERD)   vs  causing  secondary (" extra esophageal")  GERD from wide swings in gastric pressure that occur with throat clearing, often  promoting self use of mint and menthol lozenges that reduce the lower esophageal sphincter tone and exacerbate the problem further in a cyclical fashion.   These are the same pts (now being labeled as having "irritable larynx syndrome" by some cough centers) who not infrequently have a history of having failed to tolerate ace inhibitors,  dry powder inhalers like advair  or biphosphonates or report having atypical/extraesophageal reflux symptoms that don't respond to standard doses of PPI  and are easily confused as having aecopd or asthma flares by even experienced allergists/ pulmonologists (myself included).    Of the three most common causes of  Sub-acute / recurrent or chronic cough, only one (GERD)  can actually contribute to/ trigger  the other two (asthma and post nasal drip syndrome)  and perpetuate the cylce of cough.  While not intuitively obvious, many patients with chronic low grade reflux do not cough until there is a primary insult that disturbs the protective epithelial barrier and exposes sensitive nerve endings.   This is typically viral but can due to PNDS and  either may apply here.   >>>>    The point is that once this occurs, it is difficult to eliminate the cycle  using anything but a maximally effective acid suppression regimen at least in the short run, accompanied by an appropriate diet to address non acid GERD and control / eliminate the cough itself for at least 3 days with tessalon 200 and delsym and avoid narcotics for now plus 1st gen H1 blockers per guidelines and  short course pred for any upper airway T2 driven inflammatory component  If  not better in 2 weeks return with all meds in hand using a trust but verify approach to confirm accurate Medication  Reconciliation The principal here is that until we are certain that the  patients are doing what we've asked, it makes no sense to ask them to do more.   Morbid obesity due to excess calories (HCC) Body mass index is 53.31 kg/m.     No results found for: TSH   Contributing to gerd risk/ doe/reviewed the need and the process to achieve and maintain neg calorie balance > defer f/u primary care including intermittently monitoring thyroid status         Total time devoted to counseling  > 50 % of initial 60 min office visit:  review case with pt/ discussion of options/alternatives/ personally creating written customized instructions  in presence of pt  then going over those specific  Instructions directly with the pt including how to use all of the meds but in particular covering each new medication in detail and the difference between the maintenance= "automatic" meds and the prns using an action plan format for the latter (If this problem/symptom => do that organization reading Left to right).  Please see AVS from this visit for a full list of these instructions which I personally wrote for this pt and  are unique to this visit.      Sandrea Hughs, MD 02/24/2018

## 2018-03-05 ENCOUNTER — Other Ambulatory Visit: Payer: Self-pay | Admitting: Family Medicine

## 2018-03-05 ENCOUNTER — Ambulatory Visit (INDEPENDENT_AMBULATORY_CARE_PROVIDER_SITE_OTHER): Payer: BC Managed Care – PPO

## 2018-03-05 ENCOUNTER — Ambulatory Visit: Payer: BC Managed Care – PPO | Admitting: Family Medicine

## 2018-03-05 ENCOUNTER — Encounter: Payer: Self-pay | Admitting: Family Medicine

## 2018-03-05 VITALS — BP 134/72 | HR 90 | Temp 98.1°F | Ht 69.0 in | Wt 358.0 lb

## 2018-03-05 DIAGNOSIS — M25562 Pain in left knee: Secondary | ICD-10-CM | POA: Insufficient documentation

## 2018-03-05 DIAGNOSIS — R05 Cough: Secondary | ICD-10-CM | POA: Diagnosis not present

## 2018-03-05 DIAGNOSIS — R5383 Other fatigue: Secondary | ICD-10-CM

## 2018-03-05 DIAGNOSIS — G8929 Other chronic pain: Secondary | ICD-10-CM | POA: Insufficient documentation

## 2018-03-05 DIAGNOSIS — I10 Essential (primary) hypertension: Secondary | ICD-10-CM

## 2018-03-05 DIAGNOSIS — R058 Other specified cough: Secondary | ICD-10-CM

## 2018-03-05 LAB — VITAMIN D 25 HYDROXY (VIT D DEFICIENCY, FRACTURES): VITD: 9.72 ng/mL — ABNORMAL LOW (ref 30.00–100.00)

## 2018-03-05 LAB — BASIC METABOLIC PANEL WITH GFR
BUN: 17 mg/dL (ref 6–23)
CO2: 28 meq/L (ref 19–32)
Calcium: 9 mg/dL (ref 8.4–10.5)
Chloride: 100 meq/L (ref 96–112)
Creatinine, Ser: 0.91 mg/dL (ref 0.40–1.20)
GFR: 72.86 mL/min
Glucose, Bld: 117 mg/dL — ABNORMAL HIGH (ref 70–99)
Potassium: 3.6 meq/L (ref 3.5–5.1)
Sodium: 137 meq/L (ref 135–145)

## 2018-03-05 MED ORDER — VITAMIN D (ERGOCALCIFEROL) 1.25 MG (50000 UNIT) PO CAPS: 50000.0000 [IU] | ORAL_CAPSULE | ORAL | 0 refills | Status: DC

## 2018-03-05 MED ORDER — MELOXICAM 15 MG PO TABS: 15.0000 mg | ORAL_TABLET | Freq: Every day | ORAL | 0 refills | Status: DC

## 2018-03-05 NOTE — Progress Notes (Signed)
Rebecca Irwin - 38 y.o. female MRN [id number]  Date of birth: 1980/10/15  Subjective Chief Complaint  Patient presents with  . Follow-up    tolerating hygroton daily. Being followed by Pulmonology for ongoing cough and SOB    HPI Rebecca Irwin is a 38 y.o. female with history of HTN and chronic cough here today for follow up.    -HTN: Started on chlorthalidone previously, doing well with this. She denies side effects or symptoms of hypotension.  She has been referred for sleep study and has consultation next month.    -Cough:  Recently seen by pulmonology and dx with upper airway cough syndrome.  She is on PPI and H2 blocker and recently completed another course of steroids.  She still has occasional cough but this is improved.  She does report that every time she completes a course of steroids the feeling of tightness across her upper chest returns.  Describes this as a dull, continuous ache.  Tends to be worsened by movement or talking for prolonged periods.   -Knee pain:  L Knee pain off and on for several years.  Reports prior injury during high school.  Noticed improvement after taking prednisone with worsening again after completion.  Denies swelling of the knee or other joint involvement.   ROS:  A comprehensive ROS was completed and negative except as noted per HPI   No Known Allergies  Past Medical History:  Diagnosis Date  . ALLERGIC RHINITIS    skin test positive 2004  . Asthma     Past Surgical History:  Procedure Laterality Date  . LITHOTRIPSY     kidney stones  . WISDOM TOOTH EXTRACTION      Social History   Socioeconomic History  . Marital status: Single    Spouse name: Not on file  . Number of children: Not on file  . Years of education: Not on file  . Highest education level: Not on file  Occupational History  . Occupation: works with Primary school teacher: [employer]  Social Needs  . Financial resource strain: Not on file  . Food  insecurity:    Worry: Not on file    Inability: Not on file  . Transportation needs:    Medical: Not on file    Non-medical: Not on file  Tobacco Use  . Smoking status: Never Smoker  . Smokeless tobacco: Never Used  Substance and Sexual Activity  . Alcohol use: Yes    Comment: occass.  . Drug use: Never  . Sexual activity: Not on file  Lifestyle  . Physical activity:    Days per week: Not on file    Minutes per session: Not on file  . Stress: Not on file  Relationships  . Social connections:    Talks on phone: Not on file    Gets together: Not on file    Attends religious service: Not on file    Active member of club or organization: Not on file    Attends meetings of clubs or organizations: Not on file    Relationship status: Not on file  Other Topics Concern  . Not on file  Social History Narrative  . Not on file    Family History  Problem Relation Age of Onset  . Allergies Father        on allergy shots  . Diabetes Maternal Grandmother   . Diabetes Maternal Grandfather   . Cancer Maternal Grandfather   .  Pancreatic cancer Maternal Grandfather   . Cancer Paternal Grandmother   . Breast cancer Paternal Grandmother   . Cancer Paternal Grandfather   . Lung cancer Paternal Grandfather     Health Maintenance  Topic Date Due  . Janet Berlin  06/02/1997  . PAP SMEAR-Modifier  06/03/1999  . HIV Screening  01/23/2019 (Originally 06/02/1993)  . INFLUENZA VACCINE  02/04/2019 (Originally 09/18/2017)    ----------------------------------------------------------------------------------------------------------------------------------------------------------------------------------------------------------------- Physical Exam BP 134/72   Pulse 90   Temp 98.1 F (36.7 C) (Oral)   Ht 5\' 9"  (1.753 m)   Wt (!) 358 lb (162.4 kg)   LMP 02/11/2018   SpO2 96%   BMI 52.87 kg/m   Physical Exam Constitutional:      Appearance: Normal appearance. She is obese. She is not  ill-appearing.  HENT:     Head: Normocephalic and atraumatic.     Mouth/Throat:     Mouth: Mucous membranes are moist.  Eyes:     General: No scleral icterus. Neck:     Musculoskeletal: Normal range of motion.  Cardiovascular:     Rate and Rhythm: Normal rate and regular rhythm.  Pulmonary:     Effort: Pulmonary effort is normal.     Breath sounds: Normal breath sounds.  Chest:     Chest wall: No tenderness.  Musculoskeletal:     Comments:  L knee without effusion.  ROM is normal.  No ligament laxity.  Negative meniscal provocation testing.   Skin:    General: Skin is warm and dry.  Neurological:     General: No focal deficit present.  Psychiatric:        Mood and Affect: Mood normal.        Behavior: Behavior normal.     ------------------------------------------------------------------------------------------------------------------------------------------------------------------------------------------------------------------- Assessment and Plan  Upper airway cough syndrome -Cough improved some, she plans to f/u with pulmonology. -Having some pressure/dull ache across chest once she completes prednisone.  ?costochondritis related to prolonged coughing.    Acute pain of left knee -Xrays ordered.   Essential hypertension -BP well controlled, continue chlorthalidone -Check K+ today -Has sleep study consult next month.

## 2018-03-05 NOTE — Progress Notes (Signed)
-  Xray shows arthritis of the knee.  Recommend meloxicam 15mg  daily as needed for pain and inflammation.  -Vitamin D levels are also low- I am going to send over a prescription for high dose vitamin d that I want her to take for 8 weeks and then change to vitamin d3 2000 IU daily OTC after completion of high dose.

## 2018-03-05 NOTE — Assessment & Plan Note (Signed)
Xrays ordered.

## 2018-03-05 NOTE — Patient Instructions (Signed)
-  We'll be in touch with lab and xray results and decide follow up based on these results.

## 2018-03-05 NOTE — Assessment & Plan Note (Signed)
-  BP well controlled, continue chlorthalidone -Check K+ today -Has sleep study consult next month.

## 2018-03-05 NOTE — Assessment & Plan Note (Signed)
-  Cough improved some, she plans to f/u with pulmonology. -Having some pressure/dull ache across chest once she completes prednisone.  ?costochondritis related to prolonged coughing.

## 2018-03-06 ENCOUNTER — Ambulatory Visit (INDEPENDENT_AMBULATORY_CARE_PROVIDER_SITE_OTHER): Payer: BC Managed Care – PPO | Admitting: Pulmonary Disease

## 2018-03-06 ENCOUNTER — Encounter: Payer: Self-pay | Admitting: Pulmonary Disease

## 2018-03-06 ENCOUNTER — Telehealth: Payer: Self-pay | Admitting: Pulmonary Disease

## 2018-03-06 VITALS — BP 126/84 | HR 87 | Ht 69.0 in | Wt 359.6 lb

## 2018-03-06 DIAGNOSIS — R058 Other specified cough: Secondary | ICD-10-CM

## 2018-03-06 DIAGNOSIS — R05 Cough: Secondary | ICD-10-CM

## 2018-03-06 LAB — NITRIC OXIDE: NITRIC OXIDE: 12

## 2018-03-06 NOTE — Progress Notes (Signed)
Discussed results with patient in office.  Nothing further is needed at this time.  Chancey Cullinane FNP  

## 2018-03-06 NOTE — Patient Instructions (Signed)
Spirometry Today >>> Showed restriction  Feno today >>> 12, this is a normal response  Protonix 40 mg tablet  >>>Please take 1 tablet daily 15 minutes to 30 minutes before your first meal of the day as well as before your other medications >>>Try to take at the same time each day >>>take this medication daily  Continue Pepcid 20 mg at night  GERD management: >>>Avoid laying flat until 2 hours after meals >>>Elevate head of the bed including entire chest >>>Reduce size of meals and amount of fat, acid, spices, caffeine and sweets >>>If you are smoking, Please stop! >>>Decrease alcohol consumption >>>Work on maintaining a healthy weight with normal BMI    Schedule sleep consult with Dr. Maple Hudson on 03/09/2018 at 10:30 PM this is a held spot which can be opened >>>I have discussed this with Reynaldo Minium and she approves this     It is flu season:   >>>Remember to be washing your hands regularly, using hand sanitizer, be careful to use around herself with has contact with people who are sick will increase her chances of getting sick yourself. >>> Best ways to protect herself from the flu: Receive the yearly flu vaccine, practice good hand hygiene washing with soap and also using hand sanitizer when available, eat a nutritious meals, get adequate rest, hydrate appropriately   Please contact the office if your symptoms worsen or you have concerns that you are not improving.   Thank you for choosing [facility] for your healthcare, and for allowing [place] to partner with you on your healthcare journey. I am thankful to be able to provide care to you today.   Elisha Headland FNP-C

## 2018-03-06 NOTE — Assessment & Plan Note (Addendum)
Assessment:  BMI 53 Mallampati 4  Plan: -Continue to work towards healthy weight -Per primary care will get established with sleep doctor -patient would like to see Dr. Maple Hudson scheduled for 03/09/2018 -Could consider referral to healthy weight and wellness clinic in the future -Consider flu vaccine at next office visit

## 2018-03-06 NOTE — Assessment & Plan Note (Addendum)
Assessment: Improved cough and exam since last office visit Adherent to Protonix daily and Pepcid daily Patient reports she is following a GERD diet Patient with chest tightness occasionally with exertion Lungs clear to auscultation today  Plan: FeNO today >>> 12 Spirometry today>>> showing restriction Patient to be scheduled for follow-up with Dr. Maple Hudson on 03/09/2018 for sleep consult Continue Protonix daily Continue Pepcid daily Patient does not need additional prednisone or antibiotics at this time Work on weight loss

## 2018-03-06 NOTE — Progress Notes (Signed)
Discussed results with patient in office.  Nothing further is needed at this time.  Greogory Cornette FNP  

## 2018-03-06 NOTE — Telephone Encounter (Signed)
Patient seen today by Elisha Headland, she needs a sleep consult and is requesting Dr. Maple Hudson to take care of sleep and pulmonary since he has seen her in past for allergy.  Patient has been seen by Dr. Sherene Sires recently.  Routing to Dr. Sherene Sires and Dr. Maple Hudson to verify the change.

## 2018-03-06 NOTE — Progress Notes (Signed)
 @Patient  ID: Rebecca HammingKristin K Irwin, female    DOB: Nov 08, 1980, 38 y.o.   MRN: [id number]  Chief Complaint  Patient presents with  . Acute Visit    Cough    Referring provider: Everrett CoombeMatthews, Cody, DO  HPI:  38 year old female never smoker followed in our office for upper airway cough syndrome.  Patient was initially referred to our office on 02/24/2018  PMH: Morbid obesity  Smoker/ Smoking History: Never Smoker Maintenance: None Pt of: Dr Sherene SiresWert   Patient was previous patient of Dr. Maple HudsonYoung for allergy.  03/06/2018  - Visit   38 year old female never smoker presenting today for acute visit.  Patient reports that she finished her prednisone that she was prescribed at her 02/24/2018 initial office consult visit with Dr. Sherene SiresWert.  She still reporting chest tightness as well as an occasional cough.  The cough is better managed while on Tessalon.  She feels the Tessalon 200 mg have helped her.  Patient is received no antibiotics through the course of this illness.  Patient with previous allergy history where she had allergy shots for 7 years this was managed under Dr. Maple HudsonYoung.  Patient is also been off Advair for 4 years.  Cough started after a viral probable pharyngitis in October/2019.  Patient was prescribed prednisone and Tessalon.  Patient then presented back to primary care on Thanksgiving and received additional prednisone and Tessalon.  The cough came back around Christmas time and patient was referred to pulmonary clinic for further evaluation.  Patient completed initial office visit on 02/24/2018 and was prescribed prednisone as well as started on GERD treatment.  Patient also had her Advair stopped at that time.  Patient reports that since that office visit she is completed her prednisone on 03/02/2018.  Patient reports the cough is been better.  She still has some aspects of chest tightness and epigastric pain.  Patient reports that the chest tightness is worse with physical exertion as well as when she is  talking a lot.  See cough ROS below     Tests:  01/22/2018-chest x-ray-opacity in the right heart stable since 2010 and doubtful of acute significance, could represent heart chatter cardiomegaly  03/06/2018-office spirometry- FVC 3.1 (72% predicted), FEV1 2.5 (71 percent), ratio 83   FENO:  Lab Results  Component Value Date   NITRICOXIDE 12 03/06/2018    PFT: No flowsheet data found.  Imaging: Dg Knee Complete 4 Views Left  Result Date: 03/05/2018 CLINICAL DATA:  Knee stiffness.  Knee pain. EXAM: LEFT KNEE - COMPLETE 4+ VIEW COMPARISON:  None. FINDINGS: Tricompartment degenerative changes in the left knee with joint space narrowing and spurring. Small joint effusion. No acute bony abnormality. Specifically, no fracture, subluxation, or dislocation. AP view of the right knee is submitted showing early joint space narrowing in the medial compartment. 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 Small joint effusion. No acute bony abnormality. Electronically Signed   By: Charlett NoseKevin  Dover M.D.   On: 03/05/2018 09:22      Specialty Problems      Pulmonary Problems   ALLERGIC RHINITIS    Qualifier: Diagnosis of  By: Renaldo FiddlerAdkins CMA, Leigh        Asthma, mild intermittent    Qualifier: Diagnosis of  By: Renaldo FiddlerAdkins CMA, Leigh        Upper airway cough syndrome    Onset Oct 2019 with uri rx advair > d/c advair 02/24/2018         No Known Allergies  There is no immunization history for the selected administration types on file for this patient.  Patient refused flu vaccine today  Past Medical History:  Diagnosis Date  . ALLERGIC RHINITIS    skin test positive 2004  . Asthma     Tobacco History: Social History   Tobacco Use  Smoking Status Never Smoker  Smokeless Tobacco Never Used   Counseling given: Yes   Outpatient Encounter Medications as of 03/06/2018  Medication Sig  . benzonatate (TESSALON) 200 MG capsule Take 1 capsule (200 mg  total) by mouth 3 (three) times daily as needed for cough.  . chlorthalidone (HYGROTON) 25 MG tablet Take 0.5 tablets (12.5 mg total) by mouth daily.  . famotidine (PEPCID) 20 MG tablet One at bedtime  . meloxicam (MOBIC) 15 MG tablet Take 1 tablet (15 mg total) by mouth daily.  . pantoprazole (PROTONIX) 40 MG tablet Take 1 tablet (40 mg total) by mouth daily. Take 30-60 min before first meal of the day  . Vitamin D, Ergocalciferol, (DRISDOL) 1.25 MG (50000 UT) CAPS capsule Take 1 capsule (50,000 Units total) by mouth every 7 (seven) days.   No facility-administered encounter medications on file as of 03/06/2018.      Review of Systems  Review of Systems  Constitutional: Negative for fatigue and fever.  HENT: Negative for congestion, sinus pressure and sinus pain.   Respiratory: Positive for cough (more controlled then before) and chest tightness. Negative for shortness of breath and wheezing.   Cardiovascular: Negative for chest pain and palpitations.  Gastrointestinal: Negative for diarrhea, nausea and vomiting.  Neurological: Negative for dizziness.  Psychiatric/Behavioral: Positive for sleep disturbance (referred for sleep eval ).     Cough ROS:   When to the symptoms start: October 2019 How are you today: Cough is improved   Have you had fever/sore throat (first 5 to 7 days of URI) or Have you had cough/nasal congestion (10 to 14 days of URI) : dry cough  Have you used anything to treat the cough, as anything improved : prednisone helps while on it, tessalon pearles  Is it a dry or wet cough: initially productive in October/2019, mainly dry now  Does the cough happen when your breathing or when you breathe out: unsure Other any triggers to your cough, or any aggravating factors: talking, up doing activities   Daily antihistamine: none  GERD treatment: Protonix and Pepcid Singulair: none  Cough checklist (bolded indicates presence):  Adherence, acid reflux, ACE inhibitor,  active sinus disease, active smoking, adverse effects of medications (amiodarone/Macrodantin/bb), alpha 1, allergies, aspiration, anxiety, bronchiectasis, congestive heart failure (diastolic)   Physical Exam  BP 322/02 (BP Location: Left Arm, Cuff Size: Large)   Pulse 87   Ht 5\' 9"  (1.753 m)   Wt (!) 359 lb 9.6 oz (163.1 kg)   LMP 02/11/2018   SpO2 96%   BMI 53.10 kg/m   Wt Readings from Last 5 Encounters:  03/06/18 (!) 359 lb 9.6 oz (163.1 kg)  03/05/18 (!) 358 lb (162.4 kg)  02/24/18 (!) 361 lb (163.7 kg)  01/22/18 (!) 368 lb 6.4 oz (167.1 kg)  06/14/13 (!) 341 lb (154.7 kg)   >>> Continued weight increase of her past 5 years  Physical Exam  Constitutional: She is oriented to person, place, and time and well-developed, well-nourished, and in no distress. Vital signs are normal. No distress.  + Adult female morbidly obese  HENT:  Head: Normocephalic and atraumatic.  Right Ear: Hearing, tympanic membrane, external  ear and ear canal normal.  Left Ear: Hearing, tympanic membrane, external ear and ear canal normal.  Nose: Nose normal. Right sinus exhibits no maxillary sinus tenderness and no frontal sinus tenderness. Left sinus exhibits no maxillary sinus tenderness and no frontal sinus tenderness.  Mouth/Throat: Uvula is midline and oropharynx is clear and moist. No oropharyngeal exudate.  Mallampati 4  Eyes: Pupils are equal, round, and reactive to light.  Neck: Normal range of motion. Neck supple.  Cardiovascular: Normal rate, regular rhythm and normal heart sounds.  Distant heart tones  Pulmonary/Chest: Effort normal and breath sounds normal. No accessory muscle usage. No respiratory distress. She has no decreased breath sounds. She has no wheezes. She has no rhonchi.  + Distant breath sounds  Abdominal: Soft. Bowel sounds are normal. There is no abdominal tenderness.  Musculoskeletal: Normal range of motion.        General: No edema.  Lymphadenopathy:    She has no  cervical adenopathy.  Neurological: She is alert and oriented to person, place, and time. Gait normal.  Skin: Skin is warm and dry. She is not diaphoretic. No erythema.  Psychiatric: Mood, memory, affect and judgment normal.  Nursing note and vitals reviewed.     Lab Results:  CBC    Component Value Date/Time   WBC 6.3 01/22/2018 1043   RBC 4.47 01/22/2018 1043   HGB 13.2 01/22/2018 1043   HCT 40.5 01/22/2018 1043   PLT 256.0 01/22/2018 1043   MCV 90.5 01/22/2018 1043   MCHC 32.6 01/22/2018 1043   RDW 13.6 01/22/2018 1043    BMET    Component Value Date/Time   NA 137 03/05/2018 0857   K 3.6 03/05/2018 0857   CL 100 03/05/2018 0857   CO2 28 03/05/2018 0857   GLUCOSE 117 (H) 03/05/2018 0857   BUN 17 03/05/2018 0857   CREATININE 0.91 03/05/2018 0857   CALCIUM 9.0 03/05/2018 0857    BNP No results found for: BNP  ProBNP No results found for: PROBNP    Assessment & Plan:   Upper airway cough syndrome Assessment: Improved cough and exam since last office visit Adherent to Protonix daily and Pepcid daily Patient reports she is following a GERD diet Patient with chest tightness occasionally with exertion Lungs clear to auscultation today  Plan: FeNO today >>> 12 Spirometry today>>> showing restriction Patient to be scheduled for follow-up with Dr. Maple HudsonYoung on 03/09/2018 for sleep consult Continue Protonix daily Continue Pepcid daily Patient does not need additional prednisone or antibiotics at this time Work on weight loss  Morbid obesity due to excess calories (HCC) Assessment:  BMI 53 Mallampati 4  Plan: -Continue to work towards healthy weight -Per primary care will get established with sleep doctor -patient would like to see Dr. Maple HudsonYoung scheduled for 03/09/2018 -Could consider referral to healthy weight and wellness clinic in the future -Consider flu vaccine at next office visit     Coral CeoBrian P Mack, NP 03/06/2018   This appointment was 28 min long  with over 50% of the time in direct face-to-face patient care, assessment, plan of care, and follow-up.

## 2018-03-06 NOTE — Telephone Encounter (Signed)
Fine with me

## 2018-03-06 NOTE — Telephone Encounter (Signed)
Will await response from CY. 

## 2018-03-08 NOTE — Telephone Encounter (Signed)
Ok with me 

## 2018-03-09 ENCOUNTER — Ambulatory Visit: Payer: BC Managed Care – PPO | Admitting: Internal Medicine

## 2018-03-09 ENCOUNTER — Encounter: Payer: Self-pay | Admitting: Internal Medicine

## 2018-03-09 VITALS — BP 120/76 | HR 71 | Ht 69.0 in | Wt 360.8 lb

## 2018-03-09 DIAGNOSIS — G4733 Obstructive sleep apnea (adult) (pediatric): Secondary | ICD-10-CM | POA: Diagnosis not present

## 2018-03-09 DIAGNOSIS — J4521 Mild intermittent asthma with (acute) exacerbation: Secondary | ICD-10-CM

## 2018-03-09 NOTE — Assessment & Plan Note (Signed)
Differential diagnosis based on history and exam.  We discussed the basic physiology, medical concerns and treatment approaches.  She understands weight loss is important and safe driving is her responsibility. Plan-schedule home sleep test.

## 2018-03-09 NOTE — Assessment & Plan Note (Signed)
For consideration, the possibility of bariatric consult.

## 2018-03-09 NOTE — Progress Notes (Signed)
HPI   03/09/2018-38 year old female never smoker transferring to my care for sleep and pulmonary issues.  Remote history of allergic rhinitis managed with allergy shots, mild intermittent asthma.  More recent upper airway cough syndrome following a viral illness in October, 2019. -----Pulmonary and Sleep Consult: seen by MWert,MD but needs to be seen for both dx's. Pt has never had sleep study.  I had managed allergy vaccine therapy for her allergic asthma and rhinitis in the past.  In recent years she has felt OTC allergy management was sufficient. Has had a slow recovery from  post viral cough syndrome this winter but is feeling much better.  Still a little residual throat clearing but no longer feeling pressure discomfort in her chest.  Steroid therapy helped.  Also she is now being treated for reflux. Her PCP was concerned about possible OSA because of her HBP and obesity.  No ENT surgery.  History of snoring but thinks she sleeps well.  Admits to napping easily anytime she is not active but denies problems at work or driving.  We discussed sleep apnea.  She asked the role of her obesity, which we discussed. I reviewed chest x-ray images.  Diaphragms are high consistent with body habitus.  Unusual right heart shadow may include a hiatal hernia and epicardial fat.  We discussed the possibility of CT in the future. CXR 01/22/2018- IMPRESSION: Opacity to the right of the heart is stable since 2010 and of doubtful acute significance. This could represent part of the heart shadow, cardiomegaly. The finding is of doubtful significance given the lack of change since 2010. Office Spirometry 03/06/2018- Mild airway obstruction Low vital capacity, perhaps due to restriction of lung volumes.  FVC 3.1/72%, FEV1 2.5/71%, ratio 1.83, FEF 25-75% 2.7/71%  Past Medical History:  Diagnosis Date  . ALLERGIC RHINITIS    skin test positive 2004  . Asthma    Past Surgical History:  Procedure Laterality Date  .  LITHOTRIPSY     kidney stones  . WISDOM TOOTH EXTRACTION     Family History  Problem Relation Age of Onset  . Allergies Father        on allergy shots  . Diabetes Maternal Grandmother   . Diabetes Maternal Grandfather   . Cancer Maternal Grandfather   . Pancreatic cancer Maternal Grandfather   . Cancer Paternal Grandmother   . Breast cancer Paternal Grandmother   . Cancer Paternal Grandfather   . Lung cancer Paternal Grandfather    Social History   Socioeconomic History  . Marital status: Single    Spouse name: Not on file  . Number of children: Not on file  . Years of education: Not on file  . Highest education level: Not on file  Occupational History  . Occupation: works with Primary school teacherT    Employer: [employer]  Social Needs  . Financial resource strain: Not on file  . Food insecurity:    Worry: Not on file    Inability: Not on file  . Transportation needs:    Medical: Not on file    Non-medical: Not on file  Tobacco Use  . Smoking status: Never Smoker  . Smokeless tobacco: Never Used  Substance and Sexual Activity  . Alcohol use: Yes    Comment: occass.  . Drug use: Never  . Sexual activity: Not on file  Lifestyle  . Physical activity:    Days per week: Not on file    Minutes per session: Not on file  .  Stress: Not on file  Relationships  . Social connections:    Talks on phone: Not on file    Gets together: Not on file    Attends religious service: Not on file    Active member of club or organization: Not on file    Attends meetings of clubs or organizations: Not on file    Relationship status: Not on file  . Intimate partner violence:    Fear of current or ex partner: Not on file    Emotionally abused: Not on file    Physically abused: Not on file    Forced sexual activity: Not on file  Other Topics Concern  . Not on file  Social History Narrative  . Not on file   ROS-see HPI   + = positive Constitutional:    weight loss, night sweats, fevers,  chills, fatigue, lassitude. HEENT:    headaches, difficulty swallowing, tooth/dental problems, sore throat,       sneezing, itching, ear ache, nasal congestion, post nasal drip, snoring CV:    chest pain, orthopnea, PND, swelling in lower extremities, anasarca,                                  dizziness, palpitations Resp:   shortness of breath with exertion or at rest.                productive cough,   +non-productive cough, coughing up of blood.              change in color of mucus.  wheezing.   Skin:    rash or lesions. GI:  No-   heartburn, indigestion, abdominal pain, nausea, vomiting, diarrhea,                 change in bowel habits, loss of appetite GU: dysuria, change in color of urine, no urgency or frequency.   flank pain. MS:   joint pain, stiffness, decreased range of motion, back pain. Neuro-     nothing unusual Psych:  change in mood or affect.  depression or anxiety.   memory loss.  OBJ- Physical Exam General- Alert, Oriented, Affect-appropriate, Distress- none acute, + morbidly obese Skin- rash-none, lesions- none, excoriation- none Lymphadenopathy- none Head- atraumatic            Eyes- Gross vision intact, PERRLA, conjunctivae and secretions clear            Ears- Hearing, canals-normal            Nose- Clear, no-Septal dev, mucus, polyps, erosion, perforation             Throat- Mallampati II , mucosa clear , drainage- none, tonsils- atrophic Neck- flexible , trachea midline, no stridor , thyroid nl, carotid no bruit Chest - symmetrical excursion , unlabored           Heart/CV- RRR , no murmur , no gallop  , no rub, nl s1 s2                           - JVD- none , edema- none, stasis changes- none, varices- none           Lung- clear to P&A, wheeze- none, cough- none , dullness-none, rub- none           Chest wall-  Abd-  Br/ Gen/ Rectal- Not done, not indicated Extrem-  cyanosis- none, clubbing, none, atrophy- none, strength- nl Neuro- grossly intact to  observation

## 2018-03-09 NOTE — Telephone Encounter (Signed)
Pt has been seen by CY today. Will close phone note. Nothing more needed at this time.

## 2018-03-09 NOTE — Assessment & Plan Note (Signed)
Bronchitis exacerbation this winter.  Shee is settling back to mild intermittent, well-controlled baseline.  We will respond as needed to exacerbations.

## 2018-03-09 NOTE — Patient Instructions (Addendum)
Order- please schedule unattended home sleep test  Dx [place] [employer] will NOT cover HST-must have inlab study-left message for patient on cell phone  Please cal me about 2 weeks after your sleep test, for results and recommendations. If appropriate, we may be able to start treatment before ai see you nest.

## 2018-04-01 ENCOUNTER — Other Ambulatory Visit: Payer: Self-pay | Admitting: Family Medicine

## 2018-04-01 MED ORDER — MELOXICAM 15 MG PO TABS: 15.0000 mg | ORAL_TABLET | Freq: Every day | ORAL | 0 refills | Status: DC

## 2018-04-08 LAB — HM PAP SMEAR

## 2018-04-14 ENCOUNTER — Institutional Professional Consult (permissible substitution): Payer: Self-pay | Admitting: Neurology

## 2018-04-15 ENCOUNTER — Encounter (HOSPITAL_BASED_OUTPATIENT_CLINIC_OR_DEPARTMENT_OTHER): Payer: BC Managed Care – PPO

## 2018-05-01 ENCOUNTER — Other Ambulatory Visit: Payer: Self-pay | Admitting: Family Medicine

## 2018-05-13 ENCOUNTER — Encounter (HOSPITAL_BASED_OUTPATIENT_CLINIC_OR_DEPARTMENT_OTHER): Payer: BC Managed Care – PPO

## 2018-05-14 ENCOUNTER — Other Ambulatory Visit: Payer: Self-pay | Admitting: Internal Medicine

## 2018-05-14 MED ORDER — PANTOPRAZOLE SODIUM 40 MG PO TBEC: 40.0000 mg | DELAYED_RELEASE_TABLET | Freq: Every day | ORAL | 2 refills | Status: DC

## 2018-05-30 ENCOUNTER — Other Ambulatory Visit: Payer: Self-pay | Admitting: Family Medicine

## 2018-06-08 ENCOUNTER — Ambulatory Visit: Payer: BC Managed Care – PPO | Admitting: Internal Medicine

## 2018-06-26 ENCOUNTER — Other Ambulatory Visit: Payer: Self-pay | Admitting: Family Medicine

## 2018-06-26 DIAGNOSIS — M25562 Pain in left knee: Secondary | ICD-10-CM

## 2018-06-26 NOTE — Telephone Encounter (Signed)
Pt requested Rx refill. Sent to Dr. Matthews for review.  

## 2018-06-29 ENCOUNTER — Telehealth: Payer: Self-pay | Admitting: Internal Medicine

## 2018-06-29 NOTE — Telephone Encounter (Signed)
Pt is wanting to know if we could try for HST again with her her insurance since with COVID rules keep changing. She says the in-lab sleep study has been pushed back until end of June from March.  Asked patient is she had inquired with her insurance since this was previously denied? NO PCC can you find out?        Instructions      Return in about 3 months (around 06/08/2018).  Order- please schedule unattended home sleep test  Dx [place] [employer] will NOT cover HST-must have inlab study-left message for patient on cell phone  Please cal me about 2 weeks after your sleep test, for results and recommendations. If appropriate, we may be able to start treatment before ai see you nest.

## 2018-06-29 NOTE — Telephone Encounter (Signed)
An in-lab sleep study is required by patient's insurance.

## 2018-06-29 NOTE — Telephone Encounter (Signed)
Pt notified. Nothing further needed

## 2018-07-07 ENCOUNTER — Telehealth: Payer: Self-pay | Admitting: Internal Medicine

## 2018-07-07 NOTE — Telephone Encounter (Signed)
Pt does not want to go into sleep lab during COVID-19. She really wanted to do a HST but insurance denied. Pt wants to know if she can hold off on do sleep study. Please advise.

## 2018-07-07 NOTE — Telephone Encounter (Signed)
Pt aware to call and cancel sleep study. 3 mos f/u cancelled Pt aware to call back once she is ready to proceed. Nothing further needed.

## 2018-07-07 NOTE — Telephone Encounter (Signed)
Sleep study can be postponed. She can contact us when she is ready to proceed. We can let her know that the sleep center has Covid precaution protocols in place, that have been approved by the infection control team at [facility].

## 2018-07-10 ENCOUNTER — Other Ambulatory Visit: Payer: Self-pay | Admitting: Family Medicine

## 2018-07-17 ENCOUNTER — Other Ambulatory Visit (HOSPITAL_COMMUNITY): Payer: BC Managed Care – PPO

## 2018-07-20 ENCOUNTER — Ambulatory Visit (HOSPITAL_BASED_OUTPATIENT_CLINIC_OR_DEPARTMENT_OTHER): Payer: BC Managed Care – PPO

## 2018-09-18 ENCOUNTER — Ambulatory Visit: Payer: BC Managed Care – PPO | Admitting: Internal Medicine

## 2018-10-11 ENCOUNTER — Other Ambulatory Visit: Payer: Self-pay | Admitting: Family Medicine

## 2018-10-11 DIAGNOSIS — M25562 Pain in left knee: Secondary | ICD-10-CM

## 2018-10-12 ENCOUNTER — Other Ambulatory Visit: Payer: Self-pay | Admitting: Obstetrics and Gynecology

## 2018-10-12 DIAGNOSIS — R928 Other abnormal and inconclusive findings on diagnostic imaging of breast: Secondary | ICD-10-CM

## 2018-10-21 ENCOUNTER — Ambulatory Visit
Admission: RE | Admit: 2018-10-21 | Discharge: 2018-10-21 | Disposition: A | Discharge status: Home / Self Care | Payer: BC Managed Care – PPO | Source: Ambulatory Visit | Attending: Obstetrics and Gynecology | Admitting: Obstetrics and Gynecology

## 2018-10-21 ENCOUNTER — Ambulatory Visit: Payer: BC Managed Care – PPO

## 2018-10-21 ENCOUNTER — Other Ambulatory Visit: Payer: Self-pay

## 2018-10-21 DIAGNOSIS — R928 Other abnormal and inconclusive findings on diagnostic imaging of breast: Secondary | ICD-10-CM

## 2018-11-24 ENCOUNTER — Other Ambulatory Visit: Payer: Self-pay

## 2018-11-25 ENCOUNTER — Ambulatory Visit (INDEPENDENT_AMBULATORY_CARE_PROVIDER_SITE_OTHER): Payer: BC Managed Care – PPO

## 2018-11-25 ENCOUNTER — Encounter: Payer: Self-pay | Admitting: Family Medicine

## 2018-11-25 DIAGNOSIS — Z23 Encounter for immunization: Secondary | ICD-10-CM

## 2018-11-25 NOTE — Progress Notes (Signed)
Pt came into the office to receive her flu vaccine. Vaccine given in the left deltoid. Pt tolerated injection well. No signs/symptoms of a reaction prior to pt leaving the exam room. Vis given to pt.

## 2019-01-08 ENCOUNTER — Other Ambulatory Visit: Payer: Self-pay | Admitting: Family Medicine

## 2019-02-03 ENCOUNTER — Other Ambulatory Visit: Payer: Self-pay | Admitting: Family Medicine

## 2019-02-03 DIAGNOSIS — M25562 Pain in left knee: Secondary | ICD-10-CM

## 2019-02-04 ENCOUNTER — Other Ambulatory Visit: Payer: Self-pay

## 2019-04-12 ENCOUNTER — Encounter: Payer: Self-pay | Admitting: Nurse Practitioner

## 2019-04-12 ENCOUNTER — Other Ambulatory Visit: Payer: Self-pay

## 2019-04-12 ENCOUNTER — Ambulatory Visit (INDEPENDENT_AMBULATORY_CARE_PROVIDER_SITE_OTHER): Payer: BC Managed Care – PPO | Admitting: Nurse Practitioner

## 2019-04-12 VITALS — BP 130/82 | HR 99 | Temp 97.6°F | Ht 69.0 in | Wt 351.2 lb

## 2019-04-12 DIAGNOSIS — M25562 Pain in left knee: Secondary | ICD-10-CM

## 2019-04-12 DIAGNOSIS — Z0000 Encounter for general adult medical examination without abnormal findings: Secondary | ICD-10-CM | POA: Diagnosis not present

## 2019-04-12 DIAGNOSIS — Z0001 Encounter for general adult medical examination with abnormal findings: Secondary | ICD-10-CM

## 2019-04-12 DIAGNOSIS — I10 Essential (primary) hypertension: Secondary | ICD-10-CM | POA: Diagnosis not present

## 2019-04-12 DIAGNOSIS — E782 Mixed hyperlipidemia: Secondary | ICD-10-CM

## 2019-04-12 DIAGNOSIS — E569 Vitamin deficiency, unspecified: Secondary | ICD-10-CM | POA: Diagnosis not present

## 2019-04-12 LAB — CBC WITH DIFFERENTIAL/PLATELET
Basophils Absolute: 0 10*3/uL (ref 0.0–0.1)
Basophils Relative: 0.6 % (ref 0.0–3.0)
Eosinophils Absolute: 0.1 10*3/uL (ref 0.0–0.7)
Eosinophils Relative: 1.9 % (ref 0.0–5.0)
HCT: 41 % (ref 36.0–46.0)
Hemoglobin: 13.6 g/dL (ref 12.0–15.0)
Lymphocytes Relative: 25 % (ref 12.0–46.0)
Lymphs Abs: 1.9 10*3/uL (ref 0.7–4.0)
MCHC: 33.2 g/dL (ref 30.0–36.0)
MCV: 88.7 fl (ref 78.0–100.0)
Monocytes Absolute: 0.4 10*3/uL (ref 0.1–1.0)
Monocytes Relative: 5.1 % (ref 3.0–12.0)
Neutro Abs: 5 10*3/uL (ref 1.4–7.7)
Neutrophils Relative %: 67.4 % (ref 43.0–77.0)
Platelets: 272 10*3/uL (ref 150.0–400.0)
RBC: 4.62 Mil/uL (ref 3.87–5.11)
RDW: 13.3 % (ref 11.5–15.5)
WBC: 7.4 10*3/uL (ref 4.0–10.5)

## 2019-04-12 LAB — LIPID PANEL
Cholesterol: 254 mg/dL — ABNORMAL HIGH (ref 0–200)
HDL: 72.2 mg/dL (ref >39.00)
NonHDL: 181.95
Total CHOL/HDL Ratio: 4
Triglycerides: 231 mg/dL — ABNORMAL HIGH (ref 0.0–149.0)
VLDL: 46.2 mg/dL — ABNORMAL HIGH (ref 0.0–40.0)

## 2019-04-12 LAB — COMPREHENSIVE METABOLIC PANEL
ALT: 12 U/L (ref 0–35)
AST: 12 U/L (ref 0–37)
Albumin: 3.8 g/dL (ref 3.5–5.2)
Alkaline Phosphatase: 82 U/L (ref 39–117)
BUN: 19 mg/dL (ref 6–23)
CO2: 23 mEq/L (ref 19–32)
Calcium: 9.1 mg/dL (ref 8.4–10.5)
Chloride: 102 mEq/L (ref 96–112)
Creatinine, Ser: 0.87 mg/dL (ref 0.40–1.20)
GFR: 71.8 mL/min (ref >60.00)
Glucose, Bld: 113 mg/dL — ABNORMAL HIGH (ref 70–99)
Potassium: 4 mEq/L (ref 3.5–5.1)
Sodium: 137 mEq/L (ref 135–145)
Total Bilirubin: 0.5 mg/dL (ref 0.2–1.2)
Total Protein: 7.3 g/dL (ref 6.0–8.3)

## 2019-04-12 LAB — LDL CHOLESTEROL, DIRECT: Direct LDL: 150 mg/dL

## 2019-04-12 LAB — TSH: TSH: 3.21 u[IU]/mL (ref 0.35–4.50)

## 2019-04-12 MED ORDER — MELOXICAM 15 MG PO TABS: 15.0000 mg | ORAL_TABLET | Freq: Every day | ORAL | 1 refills | Status: DC

## 2019-04-12 NOTE — Patient Instructions (Addendum)
Normal labs lipid panel and elevated glucose. It id important to make changes to diet in order to improve lipid panel and glucose control. Need DASH diet F/up in 3months (fasting)  We will get last pap and mammogram from [facility]). Sign medical release to get genetic test results from GYN.  Use knee brace on right knee during exercise.  Health Maintenance, Female Adopting a healthy lifestyle and getting preventive care are important in promoting health and wellness. Ask your health care provider about:  The right schedule for you to have regular tests and exams.  Things you can do on your own to prevent diseases and keep yourself healthy. What should I know about diet, weight, and exercise? Eat a healthy diet   Eat a diet that includes plenty of vegetables, fruits, low-fat dairy products, and lean protein.  Do not eat a lot of foods that are high in solid fats, added sugars, or sodium. Maintain a healthy weight Body mass index (BMI) is used to identify weight problems. It estimates body fat based on height and weight. Your health care provider can help determine your BMI and help you achieve or maintain a healthy weight. Get regular exercise Get regular exercise. This is one of the most important things you can do for your health. Most adults should:  Exercise for at least 150 minutes each week. The exercise should increase your heart rate and make you sweat (moderate-intensity exercise).  Do strengthening exercises at least twice a week. This is in addition to the moderate-intensity exercise.  Spend less time sitting. Even light physical activity can be beneficial. Watch cholesterol and blood lipids Have your blood tested for lipids and cholesterol at 41 years of age, then have this test every 5 years. Have your cholesterol levels checked more often if:  Your lipid or cholesterol levels are high.  You are older than 41 years of age.  You are at high risk for heart  disease. What should I know about cancer screening? Depending on your health history and family history, you may need to have cancer screening at various ages. This may include screening for:  Breast cancer.  Cervical cancer.  Colorectal cancer.  Skin cancer.  Lung cancer. What should I know about heart disease, diabetes, and high blood pressure? Blood pressure and heart disease  High blood pressure causes heart disease and increases the risk of stroke. This is more likely to develop in people who have high blood pressure readings, are of African descent, or are overweight.  Have your blood pressure checked: ? Every 3-5 years if you are 57-80 years of age. ? Every year if you are 55 years old or older. Diabetes Have regular diabetes screenings. This checks your fasting blood sugar level. Have the screening done:  Once every three years after age 19 if you are at a normal weight and have a low risk for diabetes.  More often and at a younger age if you are overweight or have a high risk for diabetes. What should I know about preventing infection? Hepatitis B If you have a higher risk for hepatitis B, you should be screened for this virus. Talk with your health care provider to find out if you are at risk for hepatitis B infection. Hepatitis C Testing is recommended for:  Everyone born from [birth year] through 1967.  Anyone with known risk factors for hepatitis C. Sexually transmitted infections (STIs)  Get screened for STIs, including gonorrhea and chlamydia, if: ? You are  sexually active and are younger than 41 years of age. ? You are older than 41 years of age and your health care provider tells you that you are at risk for this type of infection. ? Your sexual activity has changed since you were last screened, and you are at increased risk for chlamydia or gonorrhea. Ask your health care provider if you are at risk.  Ask your health care provider about whether you are at high  risk for HIV. Your health care provider may recommend a prescription medicine to help prevent HIV infection. If you choose to take medicine to prevent HIV, you should first get tested for HIV. You should then be tested every 3 months for as long as you are taking the medicine. Pregnancy  If you are about to stop having your period (premenopausal) and you may become pregnant, seek counseling before you get pregnant.  Take 400 to 800 micrograms (mcg) of folic acid every day if you become pregnant.  Ask for birth control (contraception) if you want to prevent pregnancy. Osteoporosis and menopause Osteoporosis is a disease in which the bones lose minerals and strength with aging. This can result in bone fractures. If you are 27 years old or older, or if you are at risk for osteoporosis and fractures, ask your health care provider if you should:  Be screened for bone loss.  Take a calcium or vitamin D supplement to lower your risk of fractures.  Be given hormone replacement therapy (HRT) to treat symptoms of menopause. Follow these instructions at home: Lifestyle  Do not use any products that contain nicotine or tobacco, such as cigarettes, e-cigarettes, and chewing tobacco. If you need help quitting, ask your health care provider.  Do not use street drugs.  Do not share needles.  Ask your health care provider for help if you need support or information about quitting drugs. Alcohol use  Do not drink alcohol if: ? Your health care provider tells you not to drink. ? You are pregnant, may be pregnant, or are planning to become pregnant.  If you drink alcohol: ? Limit how much you use to 0-1 drink a day. ? Limit intake if you are breastfeeding.  Be aware of how much alcohol is in your drink. In the [place] one drink equals one 12 oz bottle of beer (355 mL), one 5 oz glass of wine (148 mL), or one 1 oz glass of hard liquor (44 mL). General instructions  Schedule regular health, dental,  and eye exams.  Stay current with your vaccines.  Tell your health care provider if: ? You often feel depressed. ? You have ever been abused or do not feel safe at home. Summary  Adopting a healthy lifestyle and getting preventive care are important in promoting health and wellness.  Follow your health care provider's instructions about healthy diet, exercising, and getting tested or screened for diseases.  Follow your health care provider's instructions on monitoring your cholesterol and blood pressure. This information is not intended to replace advice given to you by your health care provider. Make sure you discuss any questions you have with your health care provider. Document Revised: 01/28/2018 Document Reviewed: 01/28/2018 Elsevier Patient Education  2020 [employer].

## 2019-04-12 NOTE — Progress Notes (Signed)
Subjective:    Patient ID: Rebecca Irwin, female    DOB: December 11, 1980, 39 y.o.   MRN: [id number]  Patient presents today for complete physical and eval of right knee pain after fall in 01/2019  Knee Pain  The incident occurred at home. The injury mechanism was a fall and a twisting injury. The pain is present in the right knee. The quality of the pain is described as shooting. The pain has been intermittent since onset. Pertinent negatives include no inability to bear weight, loss of motion, loss of sensation, muscle weakness, numbness or tingling. She reports no foreign bodies present. The symptoms are aggravated by movement. She has tried nothing for the symptoms.  reports discomfort with internal rotation of knee Denies any knee instability or swelling or weakness or difficulty climbing or going down stairs. Hx of left knee DJD and pain, use of meloxicam with relief  HTN: BP at goal with chlorthalidone BP Readings from Last 3 Encounters:  04/12/19 130/82  03/09/18 120/76  03/06/18 126/84   Sexual History (orientation,birth control, marital status, STD):single, sexually active, use of OCP, up to date with PAP, pelvic and breast exam done by GYN  Depression/Suicide: Depression screen [facility] 2/9 04/12/2019 01/22/2018  Decreased Interest 0 0  Down, Depressed, Hopeless 0 0  PHQ - 2 Score 0 0   Vision:up to date, use of corrective lens  Dental:up to date  Immunizations: (TDAP, Hep C screen, Pneumovax, Influenza, zoster)  Health Maintenance  Topic Date Due  . Pap Smear  06/03/1999  . Tetanus Vaccine  04/11/2020*  . HIV Screening  04/11/2020*  . Flu Shot  Completed  *Topic was postponed. The date shown is not the original due date.   Diet:low fat and low carb  Weight:  Wt Readings from Last 3 Encounters:  04/12/19 (!) 351 lb 3.2 oz (159.3 kg)  03/09/18 (!) 360 lb 12.8 oz (163.7 kg)  03/06/18 (!) 359 lb 9.6 oz (163.1 kg)   Exercise:none due to cold weather and acute  knee pain  Fall Risk: Fall Risk  04/12/2019 01/22/2018  Falls in the past year? 1 0  Number falls in past yr: 0 -  Injury with Fall? 0 -   Medications and allergies reviewed with patient and updated if appropriate.  Patient Active Problem List   Diagnosis Date Noted  . Mixed hyperlipidemia 04/15/2019  . Obstructive sleep apnea 03/09/2018  . Acute pain of left knee 03/05/2018  . Essential hypertension 01/22/2018  . Upper airway cough syndrome 01/22/2018  . Obesity 12/01/2006  . ALLERGIC RHINITIS 12/01/2006  . Asthma, mild intermittent 12/01/2006   Current Outpatient Medications on File Prior to Visit  Medication Sig Dispense Refill  . Vitamin D, Ergocalciferol, (DRISDOL) 1.25 MG (50000 UT) CAPS capsule Take 1 capsule (50,000 Units total) by mouth every 7 (seven) days. 8 capsule 0   No current facility-administered medications on file prior to visit.    Past Medical History:  Diagnosis Date  . ALLERGIC RHINITIS    skin test positive 2004  . Asthma     Past Surgical History:  Procedure Laterality Date  . LITHOTRIPSY     kidney stones  . WISDOM TOOTH EXTRACTION      Social History   Socioeconomic History  . Marital status: Single    Spouse name: Not on file  . Number of children: Not on file  . Years of education: Not on file  . Highest education level: Not on file  Occupational History  .  Occupation: works with Primary school teacher: [employer]  Tobacco Use  . Smoking status: Never Smoker  . Smokeless tobacco: Never Used  Substance and Sexual Activity  . Alcohol use: Yes    Comment: occass.  . Drug use: Never  . Sexual activity: Not Currently    Birth control/protection: Pill  Other Topics Concern  . Not on file  Social History Narrative  . Not on file   Social Determinants of Health   Financial Resource Strain:   . Difficulty of Paying Living Expenses: Not on file  Food Insecurity:   . Worried About Programme researcher, broadcasting/film/video in the Last Year: Not on  file  . Ran Out of Food in the Last Year: Not on file  Transportation Needs:   . Lack of Transportation (Medical): Not on file  . Lack of Transportation (Non-Medical): Not on file  Physical Activity:   . Days of Exercise per Week: Not on file  . Minutes of Exercise per Session: Not on file  Stress:   . Feeling of Stress : Not on file  Social Connections:   . Frequency of Communication with Friends and Family: Not on file  . Frequency of Social Gatherings with Friends and Family: Not on file  . Attends Religious Services: Not on file  . Active Member of Clubs or Organizations: Not on file  . Attends Banker Meetings: Not on file  . Marital Status: Not on file    Family History  Problem Relation Age of Onset  . Allergies Father        on allergy shots  . Diabetes Maternal Grandmother   . Diabetes Maternal Grandfather   . Pancreatic cancer Maternal Grandfather   . Breast cancer Paternal Grandmother 36  . Lung cancer Paternal Grandfather         Review of Systems  Constitutional: Negative for fever, malaise/fatigue and weight loss.  HENT: Negative for congestion and sore throat.   Eyes:       Negative for visual changes  Respiratory: Negative for cough and shortness of breath.   Cardiovascular: Negative for chest pain, palpitations and leg swelling.  Gastrointestinal: Negative for blood in stool, constipation, diarrhea and heartburn.  Genitourinary: Negative for dysuria, frequency and urgency.  Musculoskeletal: Positive for falls and joint pain. Negative for myalgias.  Skin: Negative for rash.  Neurological: Negative for dizziness, tingling, sensory change, numbness and headaches.  Endo/Heme/Allergies: Does not bruise/bleed easily.  Psychiatric/Behavioral: Negative for depression, substance abuse and suicidal ideas. The patient is not nervous/anxious.     Objective:   Vitals:   04/12/19 0914  BP: 130/82  Pulse: 99  Temp: 97.6 F (36.4 C)  SpO2: 97%     Body mass index is 51.86 kg/m.   Physical Examination:  Physical Exam Vitals reviewed.  Constitutional:      General: She is not in acute distress.    Appearance: She is well-developed. She is obese.  HENT:     Right Ear: Tympanic membrane, ear canal and external ear normal.     Left Ear: Tympanic membrane, ear canal and external ear normal.  Eyes:     Extraocular Movements: Extraocular movements intact.     Conjunctiva/sclera: Conjunctivae normal.  Cardiovascular:     Rate and Rhythm: Normal rate and regular rhythm.     Pulses: Normal pulses.     Heart sounds: Normal heart sounds.  Pulmonary:     Effort: Pulmonary effort is normal. No  respiratory distress.     Breath sounds: Normal breath sounds.  Chest:     Chest wall: No tenderness.  Abdominal:     General: Bowel sounds are normal.     Palpations: Abdomen is soft.  Genitourinary:    Comments: Deferred breast and pelvic exam to GYN per patient Musculoskeletal:        General: No swelling, tenderness or deformity. Normal range of motion.     Cervical back: Normal range of motion and neck supple.     Right lower leg: No edema.     Left lower leg: No edema.  Skin:    General: Skin is warm and dry.     Findings: No erythema or rash.  Neurological:     Mental Status: She is alert and oriented to person, place, and time.     Deep Tendon Reflexes: Reflexes are normal and symmetric.  Psychiatric:        Mood and Affect: Mood normal.        Behavior: Behavior normal.        Thought Content: Thought content normal.     ASSESSMENT and PLAN: This visit occurred during the SARS-CoV-2 public health emergency.  Safety protocols were in place, including screening questions prior to the visit, additional usage of staff PPE, and extensive cleaning of exam room while observing appropriate contact time as indicated for disinfecting solutions.   Rebecca Irwin was seen today for annual exam.  Diagnoses and all orders for this  visit:  Encounter for preventative adult health care exam with abnormal findings -     TSH -     CBC with Differential/Platelet -     Comprehensive metabolic panel  Essential hypertension -     chlorthalidone (HYGROTON) 25 MG tablet; Take 0.5 tablets (12.5 mg total) by mouth daily.  Mixed hyperlipidemia -     Lipid panel -     LDL cholesterol, direct  Vitamin deficiency -     Vitamin D 1,25 dihydroxy  Acute pain of left knee -     meloxicam (MOBIC) 15 MG tablet; Take 1 tablet (15 mg total) by mouth daily. With food    No problem-specific Assessment & Plan notes found for this encounter.      Problem List Items Addressed This Visit      Cardiovascular and Mediastinum   Essential hypertension   Relevant Medications   chlorthalidone (HYGROTON) 25 MG tablet     Other   Acute pain of left knee   Relevant Medications   meloxicam (MOBIC) 15 MG tablet   Mixed hyperlipidemia   Relevant Medications   chlorthalidone (HYGROTON) 25 MG tablet   Other Relevant Orders   Lipid panel (Completed)   LDL cholesterol, direct (Completed)    Other Visit Diagnoses    Encounter for preventative adult health care exam with abnormal findings    -  Primary   Relevant Orders   TSH (Completed)   CBC with Differential/Platelet (Completed)   Comprehensive metabolic panel (Completed)   Vitamin deficiency       Relevant Orders   Vitamin D 1,25 dihydroxy (Completed)      Follow up: Return in about 6 months (around 10/10/2019) for HTN.  Alysia Penna, NP

## 2019-04-14 LAB — VITAMIN D 1,25 DIHYDROXY
Vitamin D 1, 25 (OH)2 Total: 56 pg/mL (ref 18–72)
Vitamin D2 1, 25 (OH)2: <8 pg/mL
Vitamin D3 1, 25 (OH)2: 56 pg/mL

## 2019-04-15 DIAGNOSIS — E782 Mixed hyperlipidemia: Secondary | ICD-10-CM | POA: Insufficient documentation

## 2019-04-15 MED ORDER — CHLORTHALIDONE 25 MG PO TABS: 12.5000 mg | ORAL_TABLET | Freq: Every day | ORAL | 1 refills | Status: DC

## 2019-06-16 ENCOUNTER — Other Ambulatory Visit: Payer: Self-pay | Admitting: Nurse Practitioner

## 2019-06-16 DIAGNOSIS — M25562 Pain in left knee: Secondary | ICD-10-CM

## 2019-06-16 NOTE — Telephone Encounter (Signed)
Please advise, ok to send in Rx with refill? Pt stated Claris Gower told her to use it PRN but pt has been exercising more since the weather is getting warm. Pt has about 5 tab left.

## 2019-12-29 ENCOUNTER — Other Ambulatory Visit: Payer: Self-pay | Admitting: Nurse Practitioner

## 2019-12-29 DIAGNOSIS — I10 Essential (primary) hypertension: Secondary | ICD-10-CM

## 2020-01-10 ENCOUNTER — Other Ambulatory Visit: Payer: Self-pay

## 2020-01-11 ENCOUNTER — Encounter: Payer: Self-pay | Admitting: Nurse Practitioner

## 2020-01-11 ENCOUNTER — Ambulatory Visit: Payer: BC Managed Care – PPO | Admitting: Nurse Practitioner

## 2020-01-11 VITALS — BP 140/82 | HR 96 | Temp 97.1°F | Ht 69.0 in | Wt 353.0 lb

## 2020-01-11 DIAGNOSIS — I10 Essential (primary) hypertension: Secondary | ICD-10-CM | POA: Diagnosis not present

## 2020-01-11 DIAGNOSIS — E782 Mixed hyperlipidemia: Secondary | ICD-10-CM | POA: Diagnosis not present

## 2020-01-11 LAB — BASIC METABOLIC PANEL
BUN: 14 mg/dL (ref 6–23)
CO2: 29 mEq/L (ref 19–32)
Calcium: 9 mg/dL (ref 8.4–10.5)
Chloride: 100 mEq/L (ref 96–112)
Creatinine, Ser: 0.84 mg/dL (ref 0.40–1.20)
GFR: 86.2 mL/min (ref >60.00)
Glucose, Bld: 98 mg/dL (ref 70–99)
Potassium: 4.2 mEq/L (ref 3.5–5.1)
Sodium: 137 mEq/L (ref 135–145)

## 2020-01-11 LAB — LIPID PANEL
Cholesterol: 229 mg/dL — ABNORMAL HIGH (ref 0–200)
HDL: 68.2 mg/dL (ref >39.00)
LDL Cholesterol: 125 mg/dL — ABNORMAL HIGH (ref 0–99)
NonHDL: 160.86
Total CHOL/HDL Ratio: 3
Triglycerides: 178 mg/dL — ABNORMAL HIGH (ref 0.0–149.0)
VLDL: 35.6 mg/dL (ref 0.0–40.0)

## 2020-01-11 NOTE — Assessment & Plan Note (Addendum)
Repeat lipid panel today She is aware about need for DASH diet and daily exercise to promote weight loss  Lipid Panel     Component Value Date/Time   CHOL 254 (H) 04/12/2019 1003   TRIG 231.0 (H) 04/12/2019 1003   HDL 72.20 04/12/2019 1003   CHOLHDL 4 04/12/2019 1003   VLDL 46.2 (H) 04/12/2019 1003   LDLDIRECT 150.0 04/12/2019 1003

## 2020-01-11 NOTE — Progress Notes (Signed)
Subjective:  Patient ID: Rebecca Irwin, female    DOB: Jan 07, 1981  Age: 39 y.o. MRN: [id number]  CC: Follow-up (6 month f/u on HTN and in need of medicatin refills)  HPI  Essential hypertension BP above goal Current use of chlorthialidone BP Readings from Last 3 Encounters:  01/11/20 140/82  04/12/19 130/82  03/09/18 120/76   Repeat BMP Advised about need for DASH diet and daily exercise Maintain current medication  Mixed hyperlipidemia Repeat lipid panel today She is aware about need for DASH diet and daily exercise.  Lipid Panel     Component Value Date/Time   CHOL 254 (H) 04/12/2019 1003   TRIG 231.0 (H) 04/12/2019 1003   HDL 72.20 04/12/2019 1003   CHOLHDL 4 04/12/2019 1003   VLDL 46.2 (H) 04/12/2019 1003   LDLDIRECT 150.0 04/12/2019 1003    Reviewed past Medical, Social and Family history today.  Outpatient Medications Prior to Visit  Medication Sig Dispense Refill  . chlorthalidone (HYGROTON) 25 MG tablet Take 0.5 tablets (12.5 mg total) by mouth daily. Need office visit in order to get additional refills 45 tablet 0  . meloxicam (MOBIC) 15 MG tablet TAKE 1 TABLET(15 MG) BY MOUTH DAILY WITH FOOD 30 tablet 1  . norgestimate-ethinyl estradiol (ORTHO-CYCLEN) 0.25-35 MG-MCG tablet Take 1 tablet by mouth daily.    . Vitamin D, Ergocalciferol, (DRISDOL) 1.25 MG (50000 UT) CAPS capsule Take 1 capsule (50,000 Units total) by mouth every 7 (seven) days. 8 capsule 0   No facility-administered medications prior to visit.    ROS See HPI  Objective:  BP 140/82 (BP Location: Left Arm, Patient Position: Sitting, Cuff Size: Large)   Pulse 96   Temp (!) 97.1 F (36.2 C) (Temporal)   Ht 5\' 9"  (1.753 m)   Wt (!) 353 lb (160.1 kg)   SpO2 97%   BMI 52.13 kg/m   Physical Exam Constitutional:      Appearance: She is obese.  Cardiovascular:     Rate and Rhythm: Normal rate and regular rhythm.     Pulses: Normal pulses.     Heart sounds: Normal heart sounds.   Pulmonary:     Effort: Pulmonary effort is normal.     Breath sounds: Normal breath sounds.  Musculoskeletal:     Cervical back: Normal range of motion and neck supple.     Right lower leg: No edema.     Left lower leg: No edema.  Neurological:     Mental Status: She is alert and oriented to person, place, and time.    Assessment & Plan:  This visit occurred during the SARS-CoV-2 public health emergency.  Safety protocols were in place, including screening questions prior to the visit, additional usage of staff PPE, and extensive cleaning of exam room while observing appropriate contact time as indicated for disinfecting solutions.   Landrey was seen today for follow-up.  Diagnoses and all orders for this visit:  Essential hypertension -     Basic metabolic panel  Mixed hyperlipidemia -     Lipid panel   Problem List Items Addressed This Visit      Cardiovascular and Mediastinum   Essential hypertension - Primary    BP above goal Current use of chlorthialidone BP Readings from Last 3 Encounters:  01/11/20 140/82  04/12/19 130/82  03/09/18 120/76   Repeat BMP Advised about need for DASH diet and daily exercise Maintain current medication      Relevant Orders   Basic metabolic panel  Other   Mixed hyperlipidemia    Repeat lipid panel today She is aware about need for DASH diet and daily exercise.  Lipid Panel     Component Value Date/Time   CHOL 254 (H) 04/12/2019 1003   TRIG 231.0 (H) 04/12/2019 1003   HDL 72.20 04/12/2019 1003   CHOLHDL 4 04/12/2019 1003   VLDL 46.2 (H) 04/12/2019 1003   LDLDIRECT 150.0 04/12/2019 1003        Relevant Orders   Lipid panel      Follow-up: Return in about 6 months (around 07/10/2020) for CPE (fasting).  Alysia Penna, NP

## 2020-01-11 NOTE — Assessment & Plan Note (Signed)
BP above goal Current use of chlorthialidone BP Readings from Last 3 Encounters:  01/11/20 140/82  04/12/19 130/82  03/09/18 120/76   Repeat BMP Advised about need for DASH diet and daily exercise Maintain current medication

## 2020-01-11 NOTE — Patient Instructions (Signed)
Go to lab got blood draw Monitor BP at home 3x/week in AM Call office if BP persistently >150/90  Maintain current medications

## 2020-03-03 ENCOUNTER — Encounter: Payer: Self-pay | Admitting: Nurse Practitioner

## 2020-03-03 DIAGNOSIS — I10 Essential (primary) hypertension: Secondary | ICD-10-CM

## 2020-03-03 LAB — HM MAMMOGRAPHY

## 2020-03-05 MED ORDER — VALSARTAN 40 MG PO TABS: 40.0000 mg | ORAL_TABLET | Freq: Every day | ORAL | 5 refills | Status: DC

## 2020-03-31 ENCOUNTER — Other Ambulatory Visit: Payer: Self-pay | Admitting: Nurse Practitioner

## 2020-03-31 DIAGNOSIS — I10 Essential (primary) hypertension: Secondary | ICD-10-CM

## 2020-04-11 ENCOUNTER — Ambulatory Visit: Payer: BC Managed Care – PPO | Admitting: Nurse Practitioner

## 2020-04-18 ENCOUNTER — Other Ambulatory Visit: Payer: Self-pay

## 2020-04-18 ENCOUNTER — Ambulatory Visit: Payer: BC Managed Care – PPO | Admitting: Nurse Practitioner

## 2020-04-18 ENCOUNTER — Encounter: Payer: Self-pay | Admitting: Nurse Practitioner

## 2020-04-18 VITALS — BP 130/82 | HR 96 | Temp 97.5°F | Ht 69.0 in | Wt 342.2 lb

## 2020-04-18 DIAGNOSIS — I10 Essential (primary) hypertension: Secondary | ICD-10-CM

## 2020-04-18 MED ORDER — CHLORTHALIDONE 25 MG PO TABS: ORAL_TABLET | ORAL | 1 refills | Status: DC

## 2020-04-18 MED ORDER — VALSARTAN 40 MG PO TABS: 40.0000 mg | ORAL_TABLET | Freq: Every day | ORAL | 3 refills | Status: DC

## 2020-04-18 NOTE — Assessment & Plan Note (Signed)
Improving BP with valsartan and chlorthialidone BP Readings from Last 3 Encounters:  04/18/20 130/82  01/11/20 140/82  04/12/19 130/82   Continue current medications F/up in 4months

## 2020-04-18 NOTE — Progress Notes (Signed)
Subjective:  Patient ID: Rebecca Irwin, female    DOB: 1980-04-29  Age: 40 y.o. MRN: [id number]  CC: Follow-up (3 month f/u on HTN, pt states she has been checking BP at home and has brought in her machine with her today. )  HPI  Essential hypertension Improving BP with valsartan and chlorthialidone BP Readings from Last 3 Encounters:  04/18/20 130/82  01/11/20 140/82  04/12/19 130/82   Continue current medications F/up in 58months  Wt Readings from Last 3 Encounters:  04/18/20 (!) 342 lb 3.2 oz (155.2 kg)  01/11/20 (!) 353 lb (160.1 kg)  04/12/19 (!) 351 lb 3.2 oz (159.3 kg)   Reviewed past Medical, Social and Family history today.  Outpatient Medications Prior to Visit  Medication Sig Dispense Refill  . meloxicam (MOBIC) 15 MG tablet TAKE 1 TABLET(15 MG) BY MOUTH DAILY WITH FOOD 30 tablet 1  . norgestimate-ethinyl estradiol (ORTHO-CYCLEN) 0.25-35 MG-MCG tablet Take 1 tablet by mouth daily.    . Vitamin D, Ergocalciferol, (DRISDOL) 1.25 MG (50000 UT) CAPS capsule Take 1 capsule (50,000 Units total) by mouth every 7 (seven) days. 8 capsule 0  . chlorthalidone (HYGROTON) 25 MG tablet TAKE 1/2 TABLET(12.5 MG) BY MOUTH DAILY 45 tablet 0  . valsartan (DIOVAN) 40 MG tablet Take 1 tablet (40 mg total) by mouth daily. 30 tablet 5   No facility-administered medications prior to visit.   ROS See HPI  Objective:  BP 130/82 (BP Location: Left Arm, Patient Position: Sitting, Cuff Size: Large)   Pulse 96   Temp (!) 97.5 F (36.4 C) (Temporal)   Ht 5\' 9"  (1.753 m)   Wt (!) 342 lb 3.2 oz (155.2 kg)   SpO2 96%   BMI 50.53 kg/m   Physical Exam Vitals reviewed.  Constitutional:      Appearance: She is obese.  Cardiovascular:     Rate and Rhythm: Normal rate and regular rhythm.     Pulses: Normal pulses.     Heart sounds: Normal heart sounds.  Pulmonary:     Effort: Pulmonary effort is normal.     Breath sounds: Normal breath sounds.  Musculoskeletal:     Right  lower leg: No edema.     Left lower leg: No edema.  Neurological:     Mental Status: She is alert and oriented to person, place, and time.    Assessment & Plan:  This visit occurred during the SARS-CoV-2 public health emergency.  Safety protocols were in place, including screening questions prior to the visit, additional usage of staff PPE, and extensive cleaning of exam room while observing appropriate contact time as indicated for disinfecting solutions.   [name] was seen today for follow-up.  Diagnoses and all orders for this visit:  Essential hypertension -     chlorthalidone (HYGROTON) 25 MG tablet; TAKE 1/2 TABLET(12.5 MG) BY MOUTH DAILY -     valsartan (DIOVAN) 40 MG tablet; Take 1 tablet (40 mg total) by mouth daily.    Problem List Items Addressed This Visit      Cardiovascular and Mediastinum   Essential hypertension - Primary    Improving BP with valsartan and chlorthialidone BP Readings from Last 3 Encounters:  04/18/20 130/82  01/11/20 140/82  04/12/19 130/82   Continue current medications F/up in 74months      Relevant Medications   chlorthalidone (HYGROTON) 25 MG tablet   valsartan (DIOVAN) 40 MG tablet      Follow-up: Return in about 3 months (around 07/19/2020) for  HTN.  Wilfred Lacy, NP

## 2020-04-18 NOTE — Patient Instructions (Addendum)
Maintain current medication doses Continue to monitor BP.  Call office if >150/90 Great job with weight loss, Keep up the good work.

## 2020-07-11 ENCOUNTER — Encounter: Payer: BC Managed Care – PPO | Admitting: Nurse Practitioner

## 2020-09-14 ENCOUNTER — Encounter: Payer: BC Managed Care – PPO | Admitting: Nurse Practitioner

## 2020-11-16 ENCOUNTER — Encounter: Payer: BC Managed Care – PPO | Admitting: Nurse Practitioner

## 2020-12-19 ENCOUNTER — Encounter: Payer: BC Managed Care – PPO | Admitting: Nurse Practitioner

## 2020-12-27 ENCOUNTER — Other Ambulatory Visit: Payer: Self-pay | Admitting: Nurse Practitioner

## 2020-12-27 DIAGNOSIS — I10 Essential (primary) hypertension: Secondary | ICD-10-CM

## 2021-01-18 NOTE — Telephone Encounter (Signed)
Chart supports Rx Last seen 04/2020 Next OV 03/07/21

## 2021-03-01 ENCOUNTER — Telehealth: Payer: Self-pay | Admitting: Nurse Practitioner

## 2021-03-01 DIAGNOSIS — M7989 Other specified soft tissue disorders: Secondary | ICD-10-CM

## 2021-03-01 NOTE — Telephone Encounter (Addendum)
LVM for patient to get more details regarding request for MRI.   Pt states she went to urgent care early today and her left leg is swollen, tender to the touch, and redness all around. Pt states a [place] was ordered and she was instructed to tell our office to release the order and tell her were to go. She was prescribed an antibiotic that he wants her to hold off on until she can get the [place] to rule out a blood clot.

## 2021-03-02 ENCOUNTER — Emergency Department (HOSPITAL_BASED_OUTPATIENT_CLINIC_OR_DEPARTMENT_OTHER)
Admission: EM | Admit: 2021-03-02 | Discharge: 2021-03-02 | Disposition: A | Discharge status: Home / Self Care | Payer: BC Managed Care – PPO | Attending: Emergency Medicine | Admitting: Emergency Medicine

## 2021-03-02 ENCOUNTER — Ambulatory Visit (HOSPITAL_BASED_OUTPATIENT_CLINIC_OR_DEPARTMENT_OTHER): Admission: RE | Admit: 2021-03-02 | Payer: BC Managed Care – PPO | Source: Ambulatory Visit

## 2021-03-02 ENCOUNTER — Other Ambulatory Visit: Payer: Self-pay

## 2021-03-02 ENCOUNTER — Emergency Department (HOSPITAL_BASED_OUTPATIENT_CLINIC_OR_DEPARTMENT_OTHER): Payer: BC Managed Care – PPO

## 2021-03-02 ENCOUNTER — Encounter (HOSPITAL_BASED_OUTPATIENT_CLINIC_OR_DEPARTMENT_OTHER): Payer: Self-pay | Admitting: Urology

## 2021-03-02 DIAGNOSIS — M79605 Pain in left leg: Secondary | ICD-10-CM

## 2021-03-02 DIAGNOSIS — I8002 Phlebitis and thrombophlebitis of superficial vessels of left lower extremity: Secondary | ICD-10-CM | POA: Diagnosis not present

## 2021-03-02 DIAGNOSIS — M7989 Other specified soft tissue disorders: Secondary | ICD-10-CM

## 2021-03-02 MED ORDER — CEPHALEXIN 500 MG PO CAPS: 500.0000 mg | ORAL_CAPSULE | Freq: Four times a day (QID) | ORAL | 0 refills | Status: AC

## 2021-03-02 MED ORDER — DOXYCYCLINE HYCLATE 100 MG PO CAPS: 100.0000 mg | ORAL_CAPSULE | Freq: Two times a day (BID) | ORAL | 0 refills | Status: AC

## 2021-03-02 NOTE — Addendum Note (Signed)
Addended by: Maximino Sarin on: 03/02/2021 05:21 PM   Modules accepted: Orders

## 2021-03-02 NOTE — ED Notes (Signed)
Redness marked with skin marker.

## 2021-03-02 NOTE — Discharge Instructions (Addendum)
If there is any chance you might be pregnant please take a pregnancy test before starting the doxycycline.  As we discussed today here heart rate and blood pressure are both elevated.  This is most likely due to stress, however if you develop fevers, chest pain, shortness of breath, significant pain in this area, drainage, you note that it is rapidly spreading outside the line or have any new or concerning symptoms please seek additional medical care and evaluation.  Please discuss with your primary care doctor about if you should continue taking your birth control as this does put you at a higher risk of clots.  Please take Ibuprofen (Advil, motrin) and Tylenol (acetaminophen) to relieve your pain.    You may take up to 600 MG (3 pills) of normal strength ibuprofen every 8 hours as needed.   You make take tylenol, up to 1,000 mg (two extra strength pills) every 8 hours as needed.   It is safe to take ibuprofen and tylenol at the same time as they work differently.   Do not take more than 3,000 mg tylenol in a 24 hour period (not more than one dose every 8 hours.  Please check all medication labels as many medications such as pain and cold medications may contain tylenol.  Do not drink alcohol while taking these medications.  Do not take other NSAID'S while taking ibuprofen (such as aleve or naproxen).  Please take ibuprofen with food to decrease stomach upset.  You may have diarrhea from the antibiotics.  It is very important that you continue to take the antibiotics even if you get diarrhea unless a medical professional tells you that you may stop taking them.  If you stop too early the bacteria you are being treated for will become stronger and you may need different, more powerful antibiotics that have more side effects and worsening diarrhea.  Please stay well hydrated and consider probiotics as they may decrease the severity of your diarrhea.  Please be aware that if you take any hormonal  contraception (birth control pills, nexplanon, the ring, etc) that your birth control will not work while you are taking antibiotics and you need to use back up protection as directed on the birth control medication information insert.

## 2021-03-02 NOTE — Addendum Note (Signed)
Addended by: Michaela Corner on: 03/02/2021 08:49 AM   Modules accepted: Orders

## 2021-03-02 NOTE — Telephone Encounter (Signed)
Patient came into [facility] for STAT US  , radiology staff needed orders changed location wise .. unable to do test at location orders changed back to previous location at [address].

## 2021-03-02 NOTE — Addendum Note (Signed)
Addended by: Maximino Sarin on: 03/02/2021 05:07 PM   Modules accepted: Orders

## 2021-03-02 NOTE — ED Triage Notes (Signed)
Seen at [facility] yest for left thigh redness, sent for ultrasound but unable to send order.  States "feels tight when standing"

## 2021-03-02 NOTE — ED Provider Notes (Signed)
[facility] EMERGENCY DEPARTMENT Provider Note   CSN: [id number] Arrival date & time: 03/02/21  [id number]     History  Chief Complaint  Patient presents with   Leg Pain    Rebecca Irwin is a 41 y.o. female with no pertinent past medical history who presents today for evaluation of discoloration on her leg. She was seen at urgent care and by her PCP and sent here for an ultrasound however the wrong order was placed according to ultrasound tech causing patient to check-in.  On chart review it appears that urgent care provider ordered a arterial duplex of the left lower extremity for concern of a superficial versus deep venous thrombosis.  She states that on Tuesday her symptoms started as a spot on her proximal medial left leg that then turned into a streak.  She states that she otherwise feels fine.  She denies any fevers.  She reports that she was given a prescription for Augmentin and told that if her DVT study was negative to start taking the antibiotic. She denies any allergies.  She denies a history of similar.  She does note that she has spider veins and varicose veins which she partially attributes to the weight.  She has never had DVT before.    HPI     Home Medications Prior to Admission medications   Medication Sig Start Date End Date Taking? Authorizing Provider  cephALEXin (KEFLEX) 500 MG capsule Take 1 capsule (500 mg total) by mouth 4 (four) times daily for 7 days. 03/02/21 03/09/21 Yes Lorin Glass, PA-C  doxycycline (VIBRAMYCIN) 100 MG capsule Take 1 capsule (100 mg total) by mouth 2 (two) times daily for 5 days. 03/02/21 03/07/21 Yes Lorin Glass, PA-C  chlorthalidone (HYGROTON) 25 MG tablet TAKE 1/2 TABLET(12.5 MG) BY MOUTH DAILY 01/18/21   Nche, Charlene Brooke, NP  meloxicam (MOBIC) 15 MG tablet TAKE 1 TABLET(15 MG) BY MOUTH DAILY WITH FOOD 06/16/19   Nche, Charlene Brooke, NP  norgestimate-ethinyl estradiol (ORTHO-CYCLEN) 0.25-35 MG-MCG  tablet Take 1 tablet by mouth daily.    [provider]  valsartan (DIOVAN) 40 MG tablet Take 1 tablet (40 mg total) by mouth daily. 04/18/20   Nche, Charlene Brooke, NP  Vitamin D, Ergocalciferol, (DRISDOL) 1.25 MG (50000 UT) CAPS capsule Take 1 capsule (50,000 Units total) by mouth every 7 (seven) days. 03/05/18   Luetta Nutting, DO      Allergies    Patient has no known allergies.    Review of Systems   Review of Systems  Constitutional:  Negative for chills and fever.  Respiratory:  Negative for chest tightness and shortness of breath.   Cardiovascular:  Negative for chest pain.  Skin:  Positive for color change.  All other systems reviewed and are negative.  Physical Exam Updated Vital Signs BP (!) 160/45 (BP Location: Left Arm)    Pulse (!) 112    Temp 99.2 F (37.3 C) (Oral)    Resp 20    Ht 5\' 9"  (1.753 m)    Wt (!) 154.2 kg    SpO2 96%    BMI 50.21 kg/m  Physical Exam Vitals and nursing note reviewed.  Constitutional:      General: She is not in acute distress.    Appearance: She is not diaphoretic.  HENT:     Head: Normocephalic and atraumatic.  Eyes:     General: No scleral icterus.       Right eye: No discharge.  Left eye: No discharge.     Conjunctiva/sclera: Conjunctivae normal.  Cardiovascular:     Rate and Rhythm: Normal rate and regular rhythm.     Pulses: Normal pulses.     Comments: 2+ left DP/PT pulse.  Left foot is warm and well-perfused. Pulmonary:     Effort: Pulmonary effort is normal. No respiratory distress.     Breath sounds: No stridor.  Abdominal:     General: There is no distension.  Musculoskeletal:        General: No deformity.     Cervical back: Normal range of motion.  Skin:    General: Skin is warm and dry.     Comments: Please see clinical image.  There is a large area of discoloration on the medial proximal left thigh.  There is a palpable cord in this area that is nontender.  No abnormal surrounding induration or  fluctuance.  No wounds visualized in this area or drainage.  Neurological:     Mental Status: She is alert.     Sensory: No sensory deficit.     Motor: No abnormal muscle tone.  Psychiatric:        Behavior: Behavior normal.     ED Results / Procedures / Treatments   Labs (all labs ordered are listed, but only abnormal results are displayed) Labs Reviewed - No data to display  EKG None  Radiology US Venous Img Lower Unilateral Left (DVT)  Result Date: 03/02/2021 CLINICAL DATA:  Left leg pain and swelling for 3 days, erythema EXAM: LEFT LOWER EXTREMITY VENOUS DOPPLER ULTRASOUND TECHNIQUE: Gray-scale sonography with compression, as well as color and duplex ultrasound, were performed to evaluate the deep venous system(s) from the level of the common femoral vein through the popliteal and proximal calf veins. COMPARISON:  None. FINDINGS: VENOUS Normal compressibility of the common femoral, superficial femoral, and popliteal veins, as well as the visualized calf veins. No filling defects to suggest DVT on grayscale or color Doppler imaging. Doppler waveforms show normal direction of venous flow, normal respiratory plasticity and response to augmentation. Visualized portions of the profundus femoral vein are unremarkable. There is superficial thrombophlebitis involving the superficial greater saphenous vein, with nonocclusive thrombus identified. Limited views of the contralateral common femoral vein are unremarkable. OTHER None. Limitations: none IMPRESSION: 1. No evidence of deep venous thrombosis within the left lower extremity. 2. Superficial thrombophlebitis involving the left greater saphenous vein, with nonocclusive thrombus identified. Electronically Signed   By: Randa Ngo M.D.   On: 03/02/2021 19:28    Procedures Procedures    Medications Ordered in ED Medications - No data to display  ED Course/ Medical Decision Making/ A&P                           Medical Decision  Making  Patient is a 41 year old woman who presents today after difficulties obtaining an outpatient ultrasound for concern of a DVT. She is neurovascularly intact on my exam.  DVT study is obtained without evidence of DVT, does show superficial thrombophlebitis in the left greater saphenous vein with nonocclusive thrombus identified. I suspect that this is the primary cause of her symptoms. She had been given a prescription for antibiotics by urgent care. Prescription was for Augmentin. We discussed use of antibiotics.  This appears to be more of a issue related to superficial thrombophlebitis.   However patient wishes to use the antibiotics. Augmentin would not appear to provide adequate cellulitis/MRSA coverage,  therefore will give a prescription for doxycycline and Keflex.  Advised to stop taking the birth control.  Additionally she is already scheduled for a PCP appointment this upcoming week.  I stressed the importance of keeping that appointment for recheck, consideration of repeat ultrasound imaging to monitor for progression.  Patient's heart rate was elevated she denies any chest pain or shortness of breath.  She attributes her elevated heart rate due to being upset about prolonged ultrasound orders being placed by different provider along with being anxious and in the emergency room.  This did improve and was under 100 at the time I spoke with her. Doubt PE given lack of symptoms.   Return precautions were discussed with patient who states their understanding.  At the time of discharge patient denied any unaddressed complaints or concerns.  Patient is agreeable for discharge home.  Note: Portions of this report may have been transcribed using voice recognition software. Every effort was made to ensure accuracy; however, inadvertent computerized transcription errors may be present   Final Clinical Impression(s) / ED Diagnoses Final diagnoses:  Left leg pain  Thrombophlebitis of  superficial veins of left lower extremity    Rx / DC Orders ED Discharge Orders          Ordered    cephALEXin (KEFLEX) 500 MG capsule  4 times daily        03/02/21 2017    doxycycline (VIBRAMYCIN) 100 MG capsule  2 times daily        03/02/21 2017              Ollen Gross 03/03/21 2149    Hayden Rasmussen, MD 03/04/21 1013

## 2021-03-03 ENCOUNTER — Encounter (HOSPITAL_COMMUNITY): Payer: Self-pay

## 2021-03-07 ENCOUNTER — Encounter: Payer: Self-pay | Admitting: Nurse Practitioner

## 2021-03-07 ENCOUNTER — Other Ambulatory Visit: Payer: Self-pay

## 2021-03-07 ENCOUNTER — Ambulatory Visit (INDEPENDENT_AMBULATORY_CARE_PROVIDER_SITE_OTHER): Payer: BC Managed Care – PPO | Admitting: Nurse Practitioner

## 2021-03-07 VITALS — BP 138/80 | HR 86 | Temp 97.0°F | Ht 67.5 in | Wt 344.6 lb

## 2021-03-07 DIAGNOSIS — Z23 Encounter for immunization: Secondary | ICD-10-CM

## 2021-03-07 DIAGNOSIS — Z0001 Encounter for general adult medical examination with abnormal findings: Secondary | ICD-10-CM | POA: Diagnosis not present

## 2021-03-07 DIAGNOSIS — I8002 Phlebitis and thrombophlebitis of superficial vessels of left lower extremity: Secondary | ICD-10-CM

## 2021-03-07 DIAGNOSIS — I10 Essential (primary) hypertension: Secondary | ICD-10-CM | POA: Diagnosis not present

## 2021-03-07 DIAGNOSIS — E782 Mixed hyperlipidemia: Secondary | ICD-10-CM

## 2021-03-07 HISTORY — DX: Phlebitis and thrombophlebitis of superficial vessels of left lower extremity: I80.02

## 2021-03-07 LAB — COMPREHENSIVE METABOLIC PANEL
ALT: 21 U/L (ref 0–35)
AST: 17 U/L (ref 0–37)
Albumin: 3.8 g/dL (ref 3.5–5.2)
Alkaline Phosphatase: 70 U/L (ref 39–117)
BUN: 17 mg/dL (ref 6–23)
CO2: 27 mEq/L (ref 19–32)
Calcium: 9.3 mg/dL (ref 8.4–10.5)
Chloride: 100 mEq/L (ref 96–112)
Creatinine, Ser: 0.94 mg/dL (ref 0.40–1.20)
GFR: 74.71 mL/min (ref >60.00)
Glucose, Bld: 105 mg/dL — ABNORMAL HIGH (ref 70–99)
Potassium: 3.5 mEq/L (ref 3.5–5.1)
Sodium: 138 mEq/L (ref 135–145)
Total Bilirubin: 0.6 mg/dL (ref 0.2–1.2)
Total Protein: 7.4 g/dL (ref 6.0–8.3)

## 2021-03-07 LAB — CBC
HCT: 41 % (ref 36.0–46.0)
Hemoglobin: 13.5 g/dL (ref 12.0–15.0)
MCHC: 32.8 g/dL (ref 30.0–36.0)
MCV: 88.6 fl (ref 78.0–100.0)
Platelets: 245 10*3/uL (ref 150.0–400.0)
RBC: 4.63 Mil/uL (ref 3.87–5.11)
RDW: 13.8 % (ref 11.5–15.5)
WBC: 5.6 10*3/uL (ref 4.0–10.5)

## 2021-03-07 LAB — LIPID PANEL
Cholesterol: 237 mg/dL — ABNORMAL HIGH (ref 0–200)
HDL: 68.7 mg/dL (ref >39.00)
LDL Cholesterol: 138 mg/dL — ABNORMAL HIGH (ref 0–99)
NonHDL: 167.88
Total CHOL/HDL Ratio: 3
Triglycerides: 147 mg/dL (ref 0.0–149.0)
VLDL: 29.4 mg/dL (ref 0.0–40.0)

## 2021-03-07 LAB — TSH: TSH: 2.62 u[IU]/mL (ref 0.35–5.50)

## 2021-03-07 NOTE — Assessment & Plan Note (Addendum)
BP at goal BP Readings from Last 3 Encounters:  03/07/21 138/80  03/02/21 (!) 160/45  04/18/20 130/82   Maintain med dose Repeat CMP

## 2021-03-07 NOTE — Assessment & Plan Note (Signed)
Repeat lipid panel ?

## 2021-03-07 NOTE — Patient Instructions (Addendum)
Align or curturelle or florastor probiotics, 1cap BID while on oral antibiotics.  Discuss use of combined estrogen vs progesterone only contraception with GYN in lieu of superficial nonocclusive thrombus in saphenous vein.  You will be contacted to schedule repeat venous US.  Ok to continue daily walking and stretching for exercise.  Go to lab for blood draw.  Preventive Care 23-43 Years Old, Female Preventive care refers to lifestyle choices and visits with your health care provider that can promote health and wellness. Preventive care visits are also called wellness exams. What can I expect for my preventive care visit? Counseling Your health care provider may ask you questions about your: Medical history, including: Past medical problems. Family medical history. Pregnancy history. Current health, including: Menstrual cycle. Method of birth control. Emotional well-being. Home life and relationship well-being. Sexual activity and sexual health. Lifestyle, including: Alcohol, nicotine or tobacco, and drug use. Access to firearms. Diet, exercise, and sleep habits. Work and work Statistician. Sunscreen use. Safety issues such as seatbelt and bike helmet use. Physical exam Your health care provider will check your: Height and weight. These may be used to calculate your BMI (body mass index). BMI is a measurement that tells if you are at a healthy weight. Waist circumference. This measures the distance around your waistline. This measurement also tells if you are at a healthy weight and may help predict your risk of certain diseases, such as type 2 diabetes and high blood pressure. Heart rate and blood pressure. Body temperature. Skin for abnormal spots. What immunizations do I need? Vaccines are usually given at various ages, according to a schedule. Your health care provider will recommend vaccines for you based on your age, medical history, and lifestyle or other factors, such  as travel or where you work. What tests do I need? Screening Your health care provider may recommend screening tests for certain conditions. This may include: Lipid and cholesterol levels. Diabetes screening. This is done by checking your blood sugar (glucose) after you have not eaten for a while (fasting). Pelvic exam and Pap test. Hepatitis B test. Hepatitis C test. HIV (human immunodeficiency virus) test. STI (sexually transmitted infection) testing, if you are at risk. Lung cancer screening. Colorectal cancer screening. Mammogram. Talk with your health care provider about when you should start having regular mammograms. This may depend on whether you have a family history of breast cancer. BRCA-related cancer screening. This may be done if you have a family history of breast, ovarian, tubal, or peritoneal cancers. Bone density scan. This is done to screen for osteoporosis. Talk with your health care provider about your test results, treatment options, and if necessary, the need for more tests. Follow these instructions at home: Eating and drinking  Eat a diet that includes fresh fruits and vegetables, whole grains, lean protein, and low-fat dairy products. Take vitamin and mineral supplements as recommended by your health care provider. Do not drink alcohol if: Your health care provider tells you not to drink. You are pregnant, may be pregnant, or are planning to become pregnant. If you drink alcohol: Limit how much you have to 0-1 drink a day. Know how much alcohol is in your drink. In the [place] one drink equals one 12 oz bottle of beer (355 mL), one 5 oz glass of wine (148 mL), or one 1 oz glass of hard liquor (44 mL). Lifestyle Brush your teeth every morning and night with fluoride toothpaste. Floss one time each day. Exercise for at least 30  minutes 5 or more days each week. Do not use any products that contain nicotine or tobacco. These products include cigarettes, chewing  tobacco, and vaping devices, such as e-cigarettes. If you need help quitting, ask your health care provider. Do not use drugs. If you are sexually active, practice safe sex. Use a condom or other form of protection to prevent STIs. If you do not wish to become pregnant, use a form of birth control. If you plan to become pregnant, see your health care provider for a prepregnancy visit. Take aspirin only as told by your health care provider. Make sure that you understand how much to take and what form to take. Work with your health care provider to find out whether it is safe and beneficial for you to take aspirin daily. Find healthy ways to manage stress, such as: Meditation, yoga, or listening to music. Journaling. Talking to a trusted person. Spending time with friends and family. Minimize exposure to UV radiation to reduce your risk of skin cancer. Safety Always wear your seat belt while driving or riding in a vehicle. Do not drive: If you have been drinking alcohol. Do not ride with someone who has been drinking. When you are tired or distracted. While texting. If you have been using any mind-altering substances or drugs. Wear a helmet and other protective equipment during sports activities. If you have firearms in your house, make sure you follow all gun safety procedures. Seek help if you have been physically or sexually abused. What's next? Visit your health care provider once a year for an annual wellness visit. Ask your health care provider how often you should have your eyes and teeth checked. Stay up to date on all vaccines. This information is not intended to replace advice given to you by your health care provider. Make sure you discuss any questions you have with your health care provider. Document Revised: 08/02/2020 Document Reviewed: 08/02/2020 Elsevier Patient Education  [place].

## 2021-03-07 NOTE — Assessment & Plan Note (Addendum)
Acute left upper leg pain, Venous US completed 03/02/21:No evidence of deep venous thrombosis within the left lower Extremity. Superficial thrombophlebitis involving the left greater saphenous vein, with nonocclusive thrombus identified. Hx of HTN: BO controlled No tobacco use No hx of PE/DVT Current use of oral abx and ASA 81mg  COC on hold at this time. Advised to use condom for contraction Repeat venous doppler in 30-45days

## 2021-03-07 NOTE — Progress Notes (Signed)
Subjective:    Patient ID: Rebecca Irwin, female    DOB: [birth year], 41 y.o.   MRN: [id number]  Patient presents today for CPE and eval of chronic/acute problems  HPI Thrombophlebitis of saphenous vein, left Acute left upper leg pain, Venous US completed 03/02/21:No evidence of deep venous thrombosis within the left lower Extremity. Superficial thrombophlebitis involving the left greater saphenous vein, with nonocclusive thrombus identified. Hx of HTN: BO controlled No tobacco use No hx of PE/DVT Current use of oral abx and ASA 81mg  COC on hold at this time. Advised to use condom for contraction Repeat venous doppler in 30-45days  Essential hypertension BP at goal BP Readings from Last 3 Encounters:  03/07/21 138/80  03/02/21 (!) 160/45  04/18/20 130/82   Maintain med dose Repeat CMP  Mixed hyperlipidemia Repeat lipid panel  Vision:up to date Dental:up to date Diet:regular Exercise:none Weight:  Wt Readings from Last 3 Encounters:  03/07/21 (!) 344 lb 9.6 oz (156.3 kg)  03/02/21 (!) 340 lb (154.2 kg)  04/18/20 (!) 342 lb 3.2 oz (155.2 kg)    Sexual History (orientation,birth control, marital status, STD):pelvic and breast exam completed by GYN  Depression/Suicide: Depression screen [facility] 2/9 03/07/2021 04/12/2019 01/22/2018  Decreased Interest 0 0 0  Down, Depressed, Hopeless 0 0 0  PHQ - 2 Score 0 0 0  Altered sleeping 1 - -  Tired, decreased energy 0 - -  Change in appetite 1 - -  Feeling bad or failure about yourself  0 - -  Trouble concentrating 0 - -  Moving slowly or fidgety/restless 0 - -  Suicidal thoughts 0 - -  PHQ-9 Score 2 - -  Difficult doing work/chores Not difficult at all - -   GAD 7 : Generalized Anxiety Score 03/07/2021  Nervous, Anxious, on Edge 3  Control/stop worrying 1  Worry too much - different things 1  Trouble relaxing 0  Restless 0  Easily annoyed or irritable 1  Afraid - awful might happen 1  Total GAD 7 Score 7   Anxiety Difficulty Not difficult at all   Immunizations: (TDAP, Hep C screen, Pneumovax, Influenza, zoster)  Health Maintenance  Topic Date Due   Flu Shot  09/18/2020   Pap Smear  04/08/2021   Hepatitis C Screening: USPSTF Recommendation to screen - Ages 18-79 yo.  03/07/2022*   HIV Screening  03/07/2022*   COVID-19 Vaccine (5 - Booster for Pfizer series) 03/14/2021   Tetanus Vaccine  03/08/2031   Pneumococcal Vaccination  Completed   HPV Vaccine  Aged Out  *Topic was postponed. The date shown is not the original due date.   Fall Risk: Fall Risk  03/07/2021 04/18/2020 01/11/2020 04/12/2019 01/22/2018  Falls in the past year? 0 0 0 1 0  Number falls in past yr: 0 0 0 0 -  Injury with Fall? 0 0 0 0 -  Risk for fall due to : No Fall Risks No Fall Risks - - -  Follow up Falls evaluation completed Falls evaluation completed - - -   Medications and allergies reviewed with patient and updated if appropriate.  Patient Active Problem List   Diagnosis Date Noted   Thrombophlebitis of saphenous vein, left 03/07/2021   Mixed hyperlipidemia 04/15/2019   Acute pain of left knee 03/05/2018   Essential hypertension 01/22/2018   Upper airway cough syndrome 01/22/2018   Obesity 12/01/2006   ALLERGIC RHINITIS 12/01/2006   Asthma, mild intermittent 12/01/2006   Current Outpatient Medications on File  Prior to Visit  Medication Sig Dispense Refill   cephALEXin (KEFLEX) 500 MG capsule Take 1 capsule (500 mg total) by mouth 4 (four) times daily for 7 days. 28 capsule 0   chlorthalidone (HYGROTON) 25 MG tablet TAKE 1/2 TABLET(12.5 MG) BY MOUTH DAILY 45 tablet 0   doxycycline (VIBRAMYCIN) 100 MG capsule Take 1 capsule (100 mg total) by mouth 2 (two) times daily for 5 days. 10 capsule 0   meloxicam (MOBIC) 15 MG tablet TAKE 1 TABLET(15 MG) BY MOUTH DAILY WITH FOOD 30 tablet 1   valsartan (DIOVAN) 40 MG tablet Take 1 tablet (40 mg total) by mouth daily. 90 tablet 3   Vitamin D, Ergocalciferol, (DRISDOL)  1.25 MG (50000 UT) CAPS capsule Take 1 capsule (50,000 Units total) by mouth every 7 (seven) days. 8 capsule 0   No current facility-administered medications on file prior to visit.    Past Medical History:  Diagnosis Date   ALLERGIC RHINITIS    skin test positive 2004   Asthma    Past Surgical History:  Procedure Laterality Date   LITHOTRIPSY     kidney stones   WISDOM TOOTH EXTRACTION     Social History   Socioeconomic History   Marital status: Single    Spouse name: Not on file   Number of children: Not on file   Years of education: Not on file   Highest education level: Not on file  Occupational History   Occupation: works with IT    Employer: [employer]  Tobacco Use   Smoking status: Never   Smokeless tobacco: Never  Vaping Use   Vaping Use: Never used  Substance and Sexual Activity   Alcohol use: Yes    Comment: occass.   Drug use: Never   Sexual activity: Not Currently    Birth control/protection: Pill  Other Topics Concern   Not on file  Social History Narrative   Not on file   Social Determinants of Health   Financial Resource Strain: Not on file  Food Insecurity: Not on file  Transportation Needs: Not on file  Physical Activity: Not on file  Stress: Not on file  Social Connections: Not on file   Family History  Problem Relation Age of Onset   Allergies Father        on allergy shots   Diabetes Maternal Grandmother    Diabetes Maternal Grandfather    Pancreatic cancer Maternal Grandfather    Breast cancer Paternal Grandmother 70   Lung cancer Paternal Grandfather        Review of Systems  Constitutional:  Negative for fever, malaise/fatigue and weight loss.  HENT:  Negative for congestion and sore throat.   Eyes:        Negative for visual changes  Respiratory:  Negative for cough and shortness of breath.   Cardiovascular:  Negative for chest pain, palpitations and leg swelling.  Gastrointestinal:  Negative for blood in stool,  constipation, diarrhea and heartburn.  Genitourinary:  Negative for dysuria, frequency and urgency.  Musculoskeletal:  Negative for falls, joint pain and myalgias.  Skin:  Negative for rash.  Neurological:  Negative for dizziness, sensory change and headaches.  Endo/Heme/Allergies:  Does not bruise/bleed easily.  Psychiatric/Behavioral:  Negative for depression, substance abuse and suicidal ideas. The patient is not nervous/anxious.    Objective:   Vitals:   03/07/21 0834  BP: 138/80  Pulse: 86  Temp: (!) 97 F (36.1 C)  SpO2: 98%   Body  mass index is 53.18 kg/m.  Physical Examination:  Physical Exam Vitals reviewed.  Constitutional:      General: She is not in acute distress.    Appearance: She is well-developed.  HENT:     Right Ear: Tympanic membrane, ear canal and external ear normal.     Left Ear: Tympanic membrane, ear canal and external ear normal.  Eyes:     Extraocular Movements: Extraocular movements intact.     Conjunctiva/sclera: Conjunctivae normal.     Pupils: Pupils are equal, round, and reactive to light.  Cardiovascular:     Rate and Rhythm: Normal rate and regular rhythm.     Heart sounds: Normal heart sounds.  Pulmonary:     Effort: Pulmonary effort is normal. No respiratory distress.     Breath sounds: Normal breath sounds.  Chest:     Chest wall: No tenderness.  Abdominal:     General: Bowel sounds are normal.     Palpations: Abdomen is soft.  Musculoskeletal:        General: Tenderness present. Normal range of motion.     Cervical back: Normal range of motion and neck supple.     Right lower leg: No edema.     Left lower leg: No edema.     Comments: Palpable chord along left upper medial aspect of thigh.  Skin:    General: Skin is warm and dry.     Findings: No erythema or rash.  Neurological:     Mental Status: She is alert and oriented to person, place, and time.     Deep Tendon Reflexes: Reflexes are normal and symmetric.   Psychiatric:        Mood and Affect: Mood normal.        Behavior: Behavior normal.        Thought Content: Thought content normal.    ASSESSMENT and PLAN: This visit occurred during the SARS-CoV-2 public health emergency.  Safety protocols were in place, including screening questions prior to the visit, additional usage of staff PPE, and extensive cleaning of exam room while observing appropriate contact time as indicated for disinfecting solutions.   Yachiyo was seen today for annual exam.  Diagnoses and all orders for this visit:  Encounter for preventative adult health care exam with abnormal findings -     Comprehensive metabolic panel -     TSH -     CBC  Mixed hyperlipidemia -     Lipid panel  Essential hypertension  Thrombophlebitis of saphenous vein, left -     Cancel: VAS [place] LOWER EXTREMITY VENOUS (DVT); Future -     US Venous Img Lower Unilateral Left (DVT); Future  Need for pneumococcal vaccine -     Pneumococcal conjugate vaccine 20-valent (Prevnar 20)  Need for diphtheria-tetanus-pertussis (Tdap) vaccine -     Tdap vaccine greater than or equal to 7yo IM      Problem List Items Addressed This Visit       Cardiovascular and Mediastinum   Essential hypertension    BP at goal BP Readings from Last 3 Encounters:  03/07/21 138/80  03/02/21 (!) 160/45  04/18/20 130/82   Maintain med dose Repeat CMP      Thrombophlebitis of saphenous vein, left    Acute left upper leg pain, Venous US completed 03/02/21:No evidence of deep venous thrombosis within the left lower Extremity. Superficial thrombophlebitis involving the left greater saphenous vein, with nonocclusive thrombus identified. Hx of HTN: BO controlled No tobacco  use No hx of PE/DVT Current use of oral abx and ASA 81mg  COC on hold at this time. Advised to use condom for contraction Repeat venous doppler in 30-45days      Relevant Orders   US Venous Img Lower Unilateral Left (DVT)      Other   Mixed hyperlipidemia    Repeat lipid panel      Relevant Orders   Lipid panel (Completed)   Other Visit Diagnoses     Encounter for preventative adult health care exam with abnormal findings    -  Primary   Relevant Orders   Comprehensive metabolic panel (Completed)   TSH (Completed)   CBC (Completed)   Need for pneumococcal vaccine       Relevant Orders   Pneumococcal conjugate vaccine 20-valent (Prevnar 20) (Completed)   Need for diphtheria-tetanus-pertussis (Tdap) vaccine       Relevant Orders   Tdap vaccine greater than or equal to 7yo IM (Completed)       Follow up: Return in about 6 months (around 09/04/2021) for HTN.  Wilfred Lacy, NP

## 2021-04-02 ENCOUNTER — Ambulatory Visit
Admission: RE | Admit: 2021-04-02 | Discharge: 2021-04-02 | Disposition: A | Discharge status: Home / Self Care | Payer: BC Managed Care – PPO | Source: Ambulatory Visit | Attending: Nurse Practitioner | Admitting: Nurse Practitioner

## 2021-04-02 DIAGNOSIS — I8002 Phlebitis and thrombophlebitis of superficial vessels of left lower extremity: Secondary | ICD-10-CM

## 2021-04-03 ENCOUNTER — Telehealth: Payer: Self-pay | Admitting: Nurse Practitioner

## 2021-04-03 NOTE — Telephone Encounter (Signed)
Pt is wanting a call back concerning a US Venous Img Lower Unilateral Left (DVT) (Accession [id number]) (Order [id number]) she had done yesterday. She has gotten the results and feels the blood clot is still there. Please advise pt at [phone]

## 2021-04-04 NOTE — Telephone Encounter (Signed)
Patient notified and verbalized understanding  Pt states she still has questions due to this particular section of the report.   Superficial Great Saphenous Vein: Redemonstrated in the left mid thigh area is a thrombosed dilated GSV as well as branching thrombosed associated branching varicosities compatible with superficial thrombosis/thrombophlebitis. Overall similar appearance to 1 month ago.  Pt states if it says similar appearance that means it is the same as a month ago and shouldn't she be concerned?  Please advise

## 2021-04-04 NOTE — Telephone Encounter (Signed)
Negative DVT Persistent thrombophlebitis Maintain use of ASA 81mg . Continue to hold hormonal contraception and f/up with GYN.

## 2021-04-05 NOTE — Telephone Encounter (Signed)
Patient notified and verbalized understanding. 

## 2021-04-08 ENCOUNTER — Other Ambulatory Visit: Payer: Self-pay | Admitting: Nurse Practitioner

## 2021-04-08 DIAGNOSIS — I10 Essential (primary) hypertension: Secondary | ICD-10-CM

## 2021-04-15 ENCOUNTER — Other Ambulatory Visit: Payer: Self-pay | Admitting: Nurse Practitioner

## 2021-04-15 DIAGNOSIS — I10 Essential (primary) hypertension: Secondary | ICD-10-CM

## 2021-07-16 ENCOUNTER — Other Ambulatory Visit: Payer: Self-pay | Admitting: Nurse Practitioner

## 2021-07-16 DIAGNOSIS — I10 Essential (primary) hypertension: Secondary | ICD-10-CM

## 2021-07-17 NOTE — Telephone Encounter (Signed)
Last OV: 02/2021 Next OV: 08/2021

## 2021-09-04 ENCOUNTER — Ambulatory Visit: Payer: BC Managed Care – PPO | Admitting: Nurse Practitioner

## 2021-09-05 LAB — HM MAMMOGRAPHY

## 2021-09-07 ENCOUNTER — Other Ambulatory Visit: Payer: Self-pay | Admitting: Obstetrics and Gynecology

## 2021-09-07 DIAGNOSIS — R928 Other abnormal and inconclusive findings on diagnostic imaging of breast: Secondary | ICD-10-CM

## 2021-09-12 ENCOUNTER — Encounter: Payer: Self-pay | Admitting: Nurse Practitioner

## 2021-09-12 ENCOUNTER — Ambulatory Visit: Payer: BC Managed Care – PPO | Admitting: Nurse Practitioner

## 2021-09-12 VITALS — BP 110/80 | HR 69 | Temp 97.7°F | Ht 67.5 in | Wt 333.8 lb

## 2021-09-12 DIAGNOSIS — I8002 Phlebitis and thrombophlebitis of superficial vessels of left lower extremity: Secondary | ICD-10-CM

## 2021-09-12 DIAGNOSIS — I10 Essential (primary) hypertension: Secondary | ICD-10-CM

## 2021-09-12 DIAGNOSIS — E782 Mixed hyperlipidemia: Secondary | ICD-10-CM | POA: Diagnosis not present

## 2021-09-12 DIAGNOSIS — E876 Hypokalemia: Secondary | ICD-10-CM

## 2021-09-12 DIAGNOSIS — E66813 Obesity, class 3: Secondary | ICD-10-CM

## 2021-09-12 DIAGNOSIS — T502X5A Adverse effect of carbonic-anhydrase inhibitors, benzothiadiazides and other diuretics, initial encounter: Secondary | ICD-10-CM

## 2021-09-12 DIAGNOSIS — Z6841 Body Mass Index (BMI) 40.0 and over, adult: Secondary | ICD-10-CM

## 2021-09-12 LAB — BASIC METABOLIC PANEL
BUN: 15 mg/dL (ref 6–23)
CO2: 30 mEq/L (ref 19–32)
Calcium: 9.2 mg/dL (ref 8.4–10.5)
Chloride: 97 mEq/L (ref 96–112)
Creatinine, Ser: 0.96 mg/dL (ref 0.40–1.20)
GFR: 72.58 mL/min (ref >60.00)
Glucose, Bld: 114 mg/dL — ABNORMAL HIGH (ref 70–99)
Potassium: 3.4 mEq/L — ABNORMAL LOW (ref 3.5–5.1)
Sodium: 136 mEq/L (ref 135–145)

## 2021-09-12 LAB — LIPID PANEL
Cholesterol: 188 mg/dL (ref 0–200)
HDL: 56.2 mg/dL (ref >39.00)
LDL Cholesterol: 109 mg/dL — ABNORMAL HIGH (ref 0–99)
NonHDL: 131.88
Total CHOL/HDL Ratio: 3
Triglycerides: 113 mg/dL (ref 0.0–149.0)
VLDL: 22.6 mg/dL (ref 0.0–40.0)

## 2021-09-12 MED ORDER — CHLORTHALIDONE 25 MG PO TABS: 12.5000 mg | ORAL_TABLET | Freq: Every day | ORAL | 1 refills | Status: DC

## 2021-09-12 MED ORDER — POTASSIUM CHLORIDE CRYS ER 20 MEQ PO TBCR: 40.0000 meq | EXTENDED_RELEASE_TABLET | Freq: Once | ORAL | 0 refills | Status: DC

## 2021-09-12 NOTE — Assessment & Plan Note (Signed)
She lost 10lbs through exercise regimen 3-4x/week Wt Readings from Last 3 Encounters:  09/12/21 (!) 333 lb 12.8 oz (151.4 kg)  03/07/21 (!) 344 lb 9.6 oz (156.3 kg)  03/02/21 (!) 340 lb (154.2 kg)   Advised about the importance of heart healthy diet and daily exercise.

## 2021-09-12 NOTE — Assessment & Plan Note (Addendum)
Repeat lipid panel: improved She has increased daily exercise, but no change in diet. Advised about the importance of DASH diet. Repeat in 24months

## 2021-09-12 NOTE — Patient Instructions (Addendum)
Ok to stop aspirin Maintain current medications. Continue regular exercise and heart healthy diet.

## 2021-09-12 NOTE — Assessment & Plan Note (Addendum)
BP at goal with chlorthalidone and valsartan BP Readings from Last 3 Encounters:  09/12/21 110/80  03/07/21 138/80  03/02/21 (!) 160/45   Maintain med dose Repeat BMP: stable renal function, low potassium KCl sent

## 2021-09-12 NOTE — Progress Notes (Signed)
Established Patient Visit  Patient: Rebecca Irwin   DOB: [birth year]   41 y.o. Female  MRN: [id number] Visit Date: 09/12/2021  Subjective:    Chief Complaint  Patient presents with   Office Visit    6 month f/u HTN & Blood clot in leg  No concerns today  Requesting records for PAP   HPI Essential hypertension BP at goal with chlorthalidone and valsartan BP Readings from Last 3 Encounters:  09/12/21 110/80  03/07/21 138/80  03/02/21 (!) 160/45   Maintain med dose Repeat BMP: stable renal function, low potassium KCl sent  Mixed hyperlipidemia Repeat lipid panel: improved She has increased daily exercise, but no change in diet. Advised about the importance of DASH diet. Repeat in 55months   Morbid obesity due to excess calories [facility]) She lost 10lbs through exercise regimen 3-4x/week Wt Readings from Last 3 Encounters:  09/12/21 (!) 333 lb 12.8 oz (151.4 kg)  03/07/21 (!) 344 lb 9.6 oz (156.3 kg)  03/02/21 (!) 340 lb (154.2 kg)   Advised about the importance of heart healthy diet and daily exercise.  Wt Readings from Last 3 Encounters:  09/12/21 (!) 333 lb 12.8 oz (151.4 kg)  03/07/21 (!) 344 lb 9.6 oz (156.3 kg)  03/02/21 (!) 340 lb (154.2 kg)    Reviewed medical, surgical, and social history today  Medications: Outpatient Medications Prior to Visit  Medication Sig   meloxicam (MOBIC) 15 MG tablet TAKE 1 TABLET(15 MG) BY MOUTH DAILY WITH FOOD   valsartan (DIOVAN) 40 MG tablet TAKE 1 TABLET(40 MG) BY MOUTH DAILY   Vitamin D, Ergocalciferol, (DRISDOL) 1.25 MG (50000 UT) CAPS capsule Take 1 capsule (50,000 Units total) by mouth every 7 (seven) days.   [DISCONTINUED] aspirin 325 MG tablet Take 325 mg by mouth daily.   [DISCONTINUED] chlorthalidone (HYGROTON) 25 MG tablet TAKE 1/2 TABLET(12.5 MG) BY MOUTH DAILY   No facility-administered medications prior to visit.   Reviewed past medical and social history.   ROS per HPI above       Objective:  BP 110/80 (BP Location: Left Arm, Patient Position: Sitting)   Pulse 69   Temp 97.7 F (36.5 C) (Temporal)   Ht 5' 7.5" (1.715 m)   Wt (!) 333 lb 12.8 oz (151.4 kg)   SpO2 93%   BMI 51.51 kg/m      Physical Exam Constitutional:      Appearance: She is obese.  Cardiovascular:     Rate and Rhythm: Normal rate and regular rhythm.     Pulses: Normal pulses.     Heart sounds: Normal heart sounds.  Pulmonary:     Effort: Pulmonary effort is normal.     Breath sounds: Normal breath sounds.  Musculoskeletal:     Right lower leg: No edema.     Left lower leg: No edema.  Skin:    General: Skin is warm and dry.     Findings: No erythema.     Comments: Varicose veins (bilateral LE), no erythema, no chords, no swelling.  Neurological:     Mental Status: She is alert and oriented to person, place, and time.     Results for orders placed or performed in visit on 09/12/21  Basic metabolic panel  Result Value Ref Range   Sodium 136 135 - 145 mEq/L   Potassium 3.4 (L) 3.5 - 5.1 mEq/L   Chloride 97 96 - 112 mEq/L  CO2 30 19 - 32 mEq/L   Glucose, Bld 114 (H) 70 - 99 mg/dL   BUN 15 6 - 23 mg/dL   Creatinine, Ser 2.72 0.40 - 1.20 mg/dL   GFR 53.66 >44.03 mL/min   Calcium 9.2 8.4 - 10.5 mg/dL  Lipid panel  Result Value Ref Range   Cholesterol 188 0 - 200 mg/dL   Triglycerides 474.2 0.0 - 149.0 mg/dL   HDL 59.56 >38.75 mg/dL   VLDL 64.3 0.0 - 32.9 mg/dL   LDL Cholesterol 518 (H) 0 - 99 mg/dL   Total CHOL/HDL Ratio 3    NonHDL 131.88       Assessment & Plan:    Problem List Items Addressed This Visit       Cardiovascular and Mediastinum   Essential hypertension - Primary    BP at goal with chlorthalidone and valsartan BP Readings from Last 3 Encounters:  09/12/21 110/80  03/07/21 138/80  03/02/21 (!) 160/45   Maintain med dose Repeat BMP: stable renal function, low potassium KCl sent      Relevant Medications   chlorthalidone (HYGROTON) 25 MG  tablet   Other Relevant Orders   Basic metabolic panel (Completed)   RESOLVED: Thrombophlebitis of saphenous vein, left   Relevant Medications   chlorthalidone (HYGROTON) 25 MG tablet     Other   Mixed hyperlipidemia    Repeat lipid panel: improved She has increased daily exercise, but no change in diet. Advised about the importance of DASH diet. Repeat in 55months       Relevant Medications   chlorthalidone (HYGROTON) 25 MG tablet   Other Relevant Orders   Lipid panel (Completed)   Morbid obesity due to excess calories (HCC)    She lost 10lbs through exercise regimen 3-4x/week Wt Readings from Last 3 Encounters:  09/12/21 (!) 333 lb 12.8 oz (151.4 kg)  03/07/21 (!) 344 lb 9.6 oz (156.3 kg)  03/02/21 (!) 340 lb (154.2 kg)   Advised about the importance of heart healthy diet and daily exercise.      Other Visit Diagnoses     Diuretic-induced hypokalemia       Relevant Medications   potassium chloride SA (KLOR-CON M) 20 MEQ tablet      Return in about 6 months (around 03/15/2022) for CPE (fasting).     Alysia Penna, NP

## 2021-09-13 ENCOUNTER — Ambulatory Visit
Admission: RE | Admit: 2021-09-13 | Discharge: 2021-09-13 | Disposition: A | Discharge status: Home / Self Care | Payer: BC Managed Care – PPO | Source: Ambulatory Visit | Attending: Obstetrics and Gynecology | Admitting: Obstetrics and Gynecology

## 2021-09-13 DIAGNOSIS — R928 Other abnormal and inconclusive findings on diagnostic imaging of breast: Secondary | ICD-10-CM

## 2021-09-18 ENCOUNTER — Other Ambulatory Visit: Payer: BC Managed Care – PPO

## 2021-11-08 ENCOUNTER — Encounter: Payer: Self-pay | Admitting: Nurse Practitioner

## 2021-11-13 ENCOUNTER — Telehealth: Payer: Self-pay | Admitting: Nurse Practitioner

## 2021-11-13 ENCOUNTER — Encounter: Payer: Self-pay | Admitting: Nurse Practitioner

## 2021-11-13 ENCOUNTER — Ambulatory Visit (HOSPITAL_COMMUNITY)
Admission: RE | Admit: 2021-11-13 | Discharge: 2021-11-13 | Disposition: A | Discharge status: Home / Self Care | Payer: BC Managed Care – PPO | Source: Ambulatory Visit | Attending: Nurse Practitioner | Admitting: Nurse Practitioner

## 2021-11-13 ENCOUNTER — Ambulatory Visit: Payer: BC Managed Care – PPO | Admitting: Nurse Practitioner

## 2021-11-13 VITALS — BP 122/82 | HR 72 | Temp 97.7°F | Ht 67.5 in | Wt 328.4 lb

## 2021-11-13 DIAGNOSIS — M79652 Pain in left thigh: Secondary | ICD-10-CM

## 2021-11-13 DIAGNOSIS — I8002 Phlebitis and thrombophlebitis of superficial vessels of left lower extremity: Secondary | ICD-10-CM | POA: Insufficient documentation

## 2021-11-13 DIAGNOSIS — T502X5A Adverse effect of carbonic-anhydrase inhibitors, benzothiadiazides and other diuretics, initial encounter: Secondary | ICD-10-CM | POA: Diagnosis not present

## 2021-11-13 DIAGNOSIS — E876 Hypokalemia: Secondary | ICD-10-CM

## 2021-11-13 DIAGNOSIS — R739 Hyperglycemia, unspecified: Secondary | ICD-10-CM | POA: Diagnosis not present

## 2021-11-13 LAB — BASIC METABOLIC PANEL
BUN: 18 mg/dL (ref 6–23)
CO2: 27 mEq/L (ref 19–32)
Calcium: 9.2 mg/dL (ref 8.4–10.5)
Chloride: 100 mEq/L (ref 96–112)
Creatinine, Ser: 0.88 mg/dL (ref 0.40–1.20)
GFR: 80.47 mL/min (ref >60.00)
Glucose, Bld: 117 mg/dL — ABNORMAL HIGH (ref 70–99)
Potassium: 3.6 mEq/L (ref 3.5–5.1)
Sodium: 136 mEq/L (ref 135–145)

## 2021-11-13 LAB — CBC
HCT: 40.6 % (ref 36.0–46.0)
Hemoglobin: 13.8 g/dL (ref 12.0–15.0)
MCHC: 33.9 g/dL (ref 30.0–36.0)
MCV: 88.4 fl (ref 78.0–100.0)
Platelets: 252 10*3/uL (ref 150.0–400.0)
RBC: 4.6 Mil/uL (ref 3.87–5.11)
RDW: 14.4 % (ref 11.5–15.5)
WBC: 5.6 10*3/uL (ref 4.0–10.5)

## 2021-11-13 LAB — HEMOGLOBIN A1C: Hgb A1c MFr Bld: 6 % (ref 4.6–6.5)

## 2021-11-13 NOTE — Telephone Encounter (Signed)
Manuela Schwartz from [facility] called and said that the exam is normal no DVT . Manuela Schwartz can be reached at [phone]

## 2021-11-13 NOTE — Progress Notes (Signed)
Established Patient Visit  Patient: Rebecca Irwin   DOB: 02-23-1980   41 y.o. Female  MRN: [id number] Visit Date: 11/13/2021  Subjective:    Chief Complaint  Patient presents with   Acute Visit    C/o issues post blood clot  Thinks it may have come back  Symptoms x 2 weeks  Flu shot given today    Leg Pain  The incident occurred more than 1 week ago. There was no injury mechanism. The pain is present in the left thigh. The quality of the pain is described as aching and burning. The pain is moderate. The pain has been Constant since onset. Pertinent negatives include no inability to bear weight, loss of motion, loss of sensation, muscle weakness, numbness or tingling. She reports no foreign bodies present. The symptoms are aggravated by palpation and movement. She has tried nothing for the symptoms.  Traveled by plane 2weeks ago-2hrs flight Hx of thrombophlebitis in left GSV Kyleena IUD inserted 1week ago.  Reviewed medical, surgical, and social history today  Medications: Outpatient Medications Prior to Visit  Medication Sig   chlorthalidone (HYGROTON) 25 MG tablet Take 0.5 tablets (12.5 mg total) by mouth daily.   MAGNESIUM CITRATE PO Take 1 tablet by mouth daily.   meloxicam (MOBIC) 15 MG tablet TAKE 1 TABLET(15 MG) BY MOUTH DAILY WITH FOOD   valsartan (DIOVAN) 40 MG tablet TAKE 1 TABLET(40 MG) BY MOUTH DAILY   Vitamin D, Ergocalciferol, (DRISDOL) 1.25 MG (50000 UT) CAPS capsule Take 1 capsule (50,000 Units total) by mouth every 7 (seven) days.   [DISCONTINUED] potassium chloride SA (KLOR-CON M) 20 MEQ tablet Take 2 tablets (40 mEq total) by mouth once for 1 dose.   No facility-administered medications prior to visit.   Reviewed past medical and social history.   ROS per HPI above      Objective:  BP 122/82 (BP Location: Right Arm, Patient Position: Sitting, Cuff Size: Normal)   Pulse 72   Temp 97.7 F (36.5 C) (Temporal)   Ht 5' 7.5" (1.715 m)    Wt (!) 328 lb 6.4 oz (149 kg)   SpO2 95%   BMI 50.68 kg/m      Physical Exam Vitals reviewed.  Cardiovascular:     Rate and Rhythm: Normal rate.     Pulses: Normal pulses.  Pulmonary:     Effort: Pulmonary effort is normal.  Musculoskeletal:        General: Tenderness present.     Left upper leg: Tenderness present. No edema or bony tenderness.     Right knee: Normal.     Left knee: Normal.     Right lower leg: Normal. No edema.     Left lower leg: Normal. No edema.       Legs:     Comments: Varicose veins  Skin:    Findings: No erythema.  Neurological:     Mental Status: She is alert and oriented to person, place, and time.    No results found for any visits on 11/13/21.    Assessment & Plan:    Problem List Items Addressed This Visit   None Visit Diagnoses     Thrombophlebitis of saphenous vein, left    -  Primary   Relevant Orders   VAS [place] LOWER EXTREMITY VENOUS (DVT)   CBC   Pain of left thigh       Relevant Orders  VAS [place] LOWER EXTREMITY VENOUS (DVT)   CBC   Hyperglycemia       Relevant Orders   Basic metabolic panel   Hemoglobin A1c   Diuretic-induced hypokalemia       Relevant Orders   Basic metabolic panel      Return if symptoms worsen or fail to improve.     Alysia Penna, NP

## 2021-11-13 NOTE — Patient Instructions (Addendum)
Start ibuprofen 600mg  every 12hrs with food Go to lab for repeat potassium level and hgbA1c due to elevated glucose in past. You will be contacted to schedule appt for leg [place]

## 2021-11-13 NOTE — Telephone Encounter (Signed)
Message sent Via MyChart

## 2021-11-14 ENCOUNTER — Other Ambulatory Visit: Payer: Self-pay | Admitting: Nurse Practitioner

## 2021-11-14 DIAGNOSIS — Z8672 Personal history of thrombophlebitis: Secondary | ICD-10-CM

## 2021-11-14 DIAGNOSIS — I83812 Varicose veins of left lower extremities with pain: Secondary | ICD-10-CM | POA: Insufficient documentation

## 2021-12-06 ENCOUNTER — Other Ambulatory Visit: Payer: Self-pay | Admitting: *Deleted

## 2021-12-06 DIAGNOSIS — I83812 Varicose veins of left lower extremities with pain: Secondary | ICD-10-CM

## 2021-12-24 ENCOUNTER — Ambulatory Visit (HOSPITAL_COMMUNITY)
Admission: RE | Admit: 2021-12-24 | Discharge: 2021-12-24 | Disposition: A | Discharge status: Home / Self Care | Payer: BC Managed Care – PPO | Source: Ambulatory Visit | Attending: Surgery | Admitting: Surgery

## 2021-12-24 ENCOUNTER — Ambulatory Visit: Payer: BC Managed Care – PPO | Admitting: Physician Assistant

## 2021-12-24 VITALS — BP 140/97 | HR 67 | Temp 98.3°F | Resp 20 | Ht 67.5 in | Wt 328.6 lb

## 2021-12-24 DIAGNOSIS — I83812 Varicose veins of left lower extremities with pain: Secondary | ICD-10-CM | POA: Diagnosis present

## 2021-12-24 DIAGNOSIS — I872 Venous insufficiency (chronic) (peripheral): Secondary | ICD-10-CM

## 2021-12-24 NOTE — Progress Notes (Signed)
Office Note     CC:  follow up Requesting Provider:  Flossie Buffy, NP  HPI: Rebecca Irwin is a 41 y.o. ([birth year]) female who presents for evaluation of left lower extremity painful varicosities.  She was diagnosed with superficial thrombophlebitis of the left leg involving a varicosity in the distal medial thigh in January of this year.  This resolved with aspirin as well as a warm compress.  She was also treated with antibiotics due to associated erythema.  Patient still describes tenderness to touch in the same area to this day.  She denies any history of DVT, venous ulcerations, trauma, or prior vascular intervention.  She has worn thigh-high compression in the past however discontinued use due to the top of the compression rolling down to the level of her knee.  She denies elevating her leg during the day.  Her job requires her to be sitting at a desk throughout most of the day.  She denies tobacco use.  She is also concerned about her superficial spider veins.   Past Medical History:  Diagnosis Date   ALLERGIC RHINITIS    skin test positive 2004   Asthma    Thrombophlebitis of saphenous vein, left 03/07/2021    Past Surgical History:  Procedure Laterality Date   LITHOTRIPSY     kidney stones   WISDOM TOOTH EXTRACTION      Social History   Socioeconomic History   Marital status: Single    Spouse name: Not on file   Number of children: Not on file   Years of education: Not on file   Highest education level: Not on file  Occupational History   Occupation: works with IT    Employer: [employer]  Tobacco Use   Smoking status: Never    Passive exposure: Never   Smokeless tobacco: Never  Vaping Use   Vaping Use: Never used  Substance and Sexual Activity   Alcohol use: Yes    Comment: occass.   Drug use: Never   Sexual activity: Not Currently    Birth control/protection: Pill  Other Topics Concern   Not on file  Social History Narrative    Not on file   Social Determinants of Health   Financial Resource Strain: Not on file  Food Insecurity: Not on file  Transportation Needs: Not on file  Physical Activity: Not on file  Stress: Not on file  Social Connections: Not on file  Intimate Partner Violence: Not on file    Family History  Problem Relation Age of Onset   Allergies Father        on allergy shots   Diabetes Maternal Grandmother    Diabetes Maternal Grandfather    Pancreatic cancer Maternal Grandfather    Breast cancer Paternal Grandmother 75   Lung cancer Paternal Grandfather     Current Outpatient Medications  Medication Sig Dispense Refill   chlorthalidone (HYGROTON) 25 MG tablet Take 0.5 tablets (12.5 mg total) by mouth daily. 45 tablet 1   MAGNESIUM CITRATE PO Take 1 tablet by mouth daily.     meloxicam (MOBIC) 15 MG tablet TAKE 1 TABLET(15 MG) BY MOUTH DAILY WITH FOOD 30 tablet 1   valsartan (DIOVAN) 40 MG tablet TAKE 1 TABLET(40 MG) BY MOUTH DAILY 90 tablet 3   No current facility-administered medications for this visit.    No Known Allergies   REVIEW OF SYSTEMS:   [X]  denotes positive finding, [ ]  denotes negative finding Cardiac  Comments:  Chest pain or chest pressure:    Shortness of breath upon exertion:    Short of breath when lying flat:    Irregular heart rhythm:        Vascular    Pain in calf, thigh, or hip brought on by ambulation:    Pain in feet at night that wakes you up from your sleep:     Blood clot in your veins:    Leg swelling:         Pulmonary    Oxygen at home:    Productive cough:     Wheezing:         Neurologic    Sudden weakness in arms or legs:     Sudden numbness in arms or legs:     Sudden onset of difficulty speaking or slurred speech:    Temporary loss of vision in one eye:     Problems with dizziness:         Gastrointestinal    Blood in stool:     Vomited blood:         Genitourinary    Burning when urinating:     Blood in urine:         Psychiatric    Major depression:         Hematologic    Bleeding problems:    Problems with blood clotting too easily:        Skin    Rashes or ulcers:        Constitutional    Fever or chills:      PHYSICAL EXAMINATION:  Vitals:   12/24/21 1027  BP: (!) 140/97  Pulse: 67  Resp: 20  Temp: 98.3 F (36.8 C)  TempSrc: Temporal  SpO2: 96%  Weight: (!) 328 lb 9.6 oz (149.1 kg)  Height: 5' 7.5" (1.715 m)    General:  WDWN in NAD; vital signs documented above Gait: Not observed HENT: WNL, normocephalic Pulmonary: normal non-labored breathing , without Rales, rhonchi,  wheezing Cardiac: regular HR Abdomen: soft, NT, no masses Skin: without rashes Vascular Exam/Pulses:  Right Left  DP 2+ (normal) 2+ (normal)   Extremities: without ischemic changes, without Gangrene , without cellulitis; without open wounds; varicose veins of the medial left thigh with tenderness to touch; varicosities of the left medial calf; spider veins of the left lateral lower leg as well as the anterior thighs bilaterally Musculoskeletal: no muscle wasting or atrophy  Neurologic: A&O X 3;  No focal weakness or paresthesias are detected Psychiatric:  The pt has Normal affect.   Non-Invasive Vascular Imaging:   Left lower extremity venous reflux study negative for DVT and negative for superficial thrombophlebitis  Incompetent left common femoral vein Incompetent left greater saphenous vein from the saphenofemoral junction down to the proximal calf with diameter greater than 4 mm throughout Incompetent anterior accessory saphenous   ASSESSMENT/PLAN:: 41 y.o. female here for evaluation of symptomatic varicose veins of the left lower extremity  -Patient has history of superficial thrombophlebitis of the left medial thigh; she still has tenderness to touch in this area -Venous reflux study was negative for DVT or superficial phlebitis however did show deep and superficial venous insufficiency.  Her  greater saphenous vein is incompetent from the saphenofemoral junction down to the proximal calf and has a diameter greater than 4 mm throughout -Patient was referred to the elastic outlet in [place] for either full compressive pantyhose versus custom made thigh-high compression of the left leg to be  worn regularly.  We also discussed proper leg elevation periodically through the day.  She will also try to avoid prolonged sitting and standing.  She will also use NSAIDs for discomfort associated with her varicose veins -Patient will follow-up in 3 months to be further evaluated for possible left greater saphenous vein ablation with or without stab phlebectomy -She also requested an evaluation for sclerotherapy; Estell Harpin RN will be in contact.   Dagoberto Ligas, PA-C [facility] [phone]  Clinic MD:   Trula Slade

## 2022-02-03 ENCOUNTER — Other Ambulatory Visit: Payer: Self-pay | Admitting: Nurse Practitioner

## 2022-02-03 DIAGNOSIS — I10 Essential (primary) hypertension: Secondary | ICD-10-CM

## 2022-02-04 NOTE — Telephone Encounter (Signed)
Chart supports Rx Last OV: 10/2021 Next OV: 02/2022  

## 2022-03-15 ENCOUNTER — Encounter: Payer: BC Managed Care – PPO | Admitting: Nurse Practitioner

## 2022-03-27 ENCOUNTER — Ambulatory Visit: Payer: BC Managed Care – PPO | Admitting: Vascular Surgery

## 2022-04-24 ENCOUNTER — Ambulatory Visit: Payer: BC Managed Care – PPO | Admitting: Vascular Surgery

## 2022-04-24 ENCOUNTER — Encounter: Payer: Self-pay | Admitting: Vascular Surgery

## 2022-04-24 VITALS — BP 115/77 | HR 75 | Temp 98.6°F | Resp 16 | Ht 69.0 in | Wt 322.0 lb

## 2022-04-24 DIAGNOSIS — I872 Venous insufficiency (chronic) (peripheral): Secondary | ICD-10-CM | POA: Diagnosis not present

## 2022-04-24 DIAGNOSIS — I83812 Varicose veins of left lower extremities with pain: Secondary | ICD-10-CM

## 2022-04-24 NOTE — Progress Notes (Signed)
REASON FOR VISIT:   Follow-up of chronic venous insufficiency  MEDICAL ISSUES:   CHRONIC VENOUS INSUFFICIENCY: This patient has deep and superficial venous reflux but is having minimal symptoms except for the 1 episode of phlebitis in the left leg.  We have discussed the importance of intermittent leg elevation and the proper positioning for this.  I have encouraged her to avoid prolonged sitting and standing.  Since she sits at a computer for work she should get up and walk around at least every hour.  We have also discussed importance of exercise specifically walking and water aerobics.  I think a knee-high stocking with a gradient of 15-28 may be more practical when she is sitting at work.  We have also discussed importance of maintaining a healthy weight as central obesity especially increases lower extremity venous pressure.  If she develops symptoms from her venous disease or recurrent episodes of phlebitis that I think she would be a candidate for laser ablation of the left great saphenous vein in the proximal thigh.  This would be done above the area where the vein exits the fascia.  She would also require 10-20 stabs.  She will call if she develops worsening symptoms.  HPI:   Rebecca Irwin is a pleasant 42 y.o. female who was seen by Arlee Muslim, PA on 12/24/2021 with painful varicose veins of the left lower extremity.  She had previously been diagnosed with superficial thrombophlebitis of the left leg in her distal medial thigh in January 2023.  At work she sits for most of the day.  On duplex she had superficial and deep venous reflux.  She was prescribed thigh-high compression stockings with a gradient of 20 to 30 mmHg and encouraged to elevate her legs.  She comes in for a 6-monthfollow-up visit.  On my history, the patient had the 1 episode of phlebitis in the medial left thigh distally.  She said no further episodes.  She is unaware of any history of DVT.  She really does  not describe significant heaviness or aching in her legs for the most part.  She sits at a computer at work and her legs typically do not bother her much at work.  She denies any significant problems with swelling.  She has been wearing her thigh-high compression stockings but they are not comfortable.  She does elevate her legs some.  Past Medical History:  Diagnosis Date   ALLERGIC RHINITIS    skin test positive 2004   Asthma    Thrombophlebitis of saphenous vein, left 03/07/2021    Family History  Problem Relation Age of Onset   Allergies Father        on allergy shots   Diabetes Maternal Grandmother    Diabetes Maternal Grandfather    Pancreatic cancer Maternal Grandfather    Breast cancer Paternal Grandmother 556  Lung cancer Paternal Grandfather     SOCIAL HISTORY: Social History   Tobacco Use   Smoking status: Never    Passive exposure: Never   Smokeless tobacco: Never  Substance Use Topics   Alcohol use: Yes    Comment: occass.    No Known Allergies  Current Outpatient Medications  Medication Sig Dispense Refill   chlorthalidone (HYGROTON) 25 MG tablet TAKE 1/2 TABLET(12.5 MG) BY MOUTH DAILY 135 tablet 2   MAGNESIUM CITRATE PO Take 1 tablet by mouth daily.     valsartan (DIOVAN) 40 MG tablet TAKE 1 TABLET(40 MG) BY MOUTH DAILY 90  tablet 3   meloxicam (MOBIC) 15 MG tablet TAKE 1 TABLET(15 MG) BY MOUTH DAILY WITH FOOD (Patient not taking: Reported on 04/24/2022) 30 tablet 1   No current facility-administered medications for this visit.    REVIEW OF SYSTEMS:  '[X]'$  denotes positive finding, '[ ]'$  denotes negative finding Cardiac  Comments:  Chest pain or chest pressure:    Shortness of breath upon exertion:    Short of breath when lying flat:    Irregular heart rhythm:        Vascular    Pain in calf, thigh, or hip brought on by ambulation:    Pain in feet at night that wakes you up from your sleep:     Blood clot in your veins: x   Leg swelling:          Pulmonary    Oxygen at home:    Productive cough:     Wheezing:         Neurologic    Sudden weakness in arms or legs:     Sudden numbness in arms or legs:     Sudden onset of difficulty speaking or slurred speech:    Temporary loss of vision in one eye:     Problems with dizziness:         Gastrointestinal    Blood in stool:     Vomited blood:         Genitourinary    Burning when urinating:     Blood in urine:        Psychiatric    Major depression:         Hematologic    Bleeding problems:    Problems with blood clotting too easily:        Skin    Rashes or ulcers:        Constitutional    Fever or chills:     PHYSICAL EXAM:   Vitals:   04/24/22 1349  BP: 115/77  Pulse: 75  Resp: 16  Temp: 98.6 F (37 C)  TempSrc: Temporal  SpO2: 97%  Weight: (!) 322 lb (146.1 kg)  Height: '5\' 9"'$  (1.753 m)   Body mass index is 47.55 kg/m.  GENERAL: The patient is a well-nourished female, in no acute distress. The vital signs are documented above. CARDIAC: There is a regular rate and rhythm.  VASCULAR: I do not detect carotid bruits. She has palpable pedal pulses. She does have some varicose veins in both legs and some telangiectasias. I did look at her left great saphenous vein myself with the SonoSite.  The vein is dilated in the thigh but then exits the fascia in the proximal to mid thigh.  She has some deeper varicosities that cannot be seen on exam without the duplex. PULMONARY: There is good air exchange bilaterally without wheezing or rales. ABDOMEN: Soft and non-tender with normal pitched bowel sounds.  MUSCULOSKELETAL: There are no major deformities or cyanosis. NEUROLOGIC: No focal weakness or paresthesias are detected. SKIN: There are no ulcers or rashes noted. PSYCHIATRIC: The patient has a normal affect.  DATA:    VENOUS DUPLEX: I have reviewed the venous duplex scan that was done on 12/24/2021.  This was of the left lower extremity only.  This showed no  evidence of DVT.  There was deep venous reflux in the common femoral vein.  There was significant superficial venous reflux in the left great saphenous vein from the saphenofemoral junction in the proximal calf.  Diameters of  the vein ranged from 4.2-8.4 mm.  There was a short segment of reflux in the left anterior accessory saphenous vein.  The results of the study are summarized in the diagram below.    A total of 40 minutes was spent on this visit. 20 minutes was face to face time. More than 50% of the time was spent on counseling and coordinating with the patient.    Rebecca Irwin [facility] [phone]

## 2022-05-03 ENCOUNTER — Other Ambulatory Visit: Payer: Self-pay | Admitting: Nurse Practitioner

## 2022-05-03 DIAGNOSIS — I10 Essential (primary) hypertension: Secondary | ICD-10-CM

## 2022-05-09 ENCOUNTER — Encounter: Payer: BC Managed Care – PPO | Admitting: Nurse Practitioner

## 2022-07-02 ENCOUNTER — Ambulatory Visit (INDEPENDENT_AMBULATORY_CARE_PROVIDER_SITE_OTHER): Payer: BC Managed Care – PPO | Admitting: Nurse Practitioner

## 2022-07-02 ENCOUNTER — Encounter: Payer: Self-pay | Admitting: Nurse Practitioner

## 2022-07-02 VITALS — BP 120/84 | HR 70 | Temp 98.4°F | Resp 16 | Ht 69.0 in | Wt 323.0 lb

## 2022-07-02 DIAGNOSIS — I10 Essential (primary) hypertension: Secondary | ICD-10-CM

## 2022-07-02 DIAGNOSIS — Z6841 Body Mass Index (BMI) 40.0 and over, adult: Secondary | ICD-10-CM

## 2022-07-02 DIAGNOSIS — R739 Hyperglycemia, unspecified: Secondary | ICD-10-CM | POA: Diagnosis not present

## 2022-07-02 DIAGNOSIS — M25562 Pain in left knee: Secondary | ICD-10-CM

## 2022-07-02 DIAGNOSIS — E782 Mixed hyperlipidemia: Secondary | ICD-10-CM

## 2022-07-02 DIAGNOSIS — Z0001 Encounter for general adult medical examination with abnormal findings: Secondary | ICD-10-CM

## 2022-07-02 DIAGNOSIS — E876 Hypokalemia: Secondary | ICD-10-CM

## 2022-07-02 DIAGNOSIS — G8929 Other chronic pain: Secondary | ICD-10-CM | POA: Diagnosis not present

## 2022-07-02 LAB — LIPID PANEL
Cholesterol: 185 mg/dL (ref 0–200)
HDL: 55.2 mg/dL (ref >39.00)
LDL Cholesterol: 105 mg/dL — ABNORMAL HIGH (ref 0–99)
NonHDL: 129.51
Total CHOL/HDL Ratio: 3
Triglycerides: 124 mg/dL (ref 0.0–149.0)
VLDL: 24.8 mg/dL (ref 0.0–40.0)

## 2022-07-02 LAB — COMPREHENSIVE METABOLIC PANEL
ALT: 9 U/L (ref 0–35)
AST: 11 U/L (ref 0–37)
Albumin: 3.9 g/dL (ref 3.5–5.2)
Alkaline Phosphatase: 72 U/L (ref 39–117)
BUN: 22 mg/dL (ref 6–23)
CO2: 28 mEq/L (ref 19–32)
Calcium: 8.9 mg/dL (ref 8.4–10.5)
Chloride: 101 mEq/L (ref 96–112)
Creatinine, Ser: 0.78 mg/dL (ref 0.40–1.20)
GFR: 92.6 mL/min (ref >60.00)
Glucose, Bld: 101 mg/dL — ABNORMAL HIGH (ref 70–99)
Potassium: 3.7 mEq/L (ref 3.5–5.1)
Sodium: 139 mEq/L (ref 135–145)
Total Bilirubin: 0.7 mg/dL (ref 0.2–1.2)
Total Protein: 6.8 g/dL (ref 6.0–8.3)

## 2022-07-02 LAB — HEMOGLOBIN A1C: Hgb A1c MFr Bld: 5.7 % (ref 4.6–6.5)

## 2022-07-02 MED ORDER — MELOXICAM 15 MG PO TABS: 15.0000 mg | ORAL_TABLET | Freq: Every day | ORAL | 1 refills | Status: DC | PRN

## 2022-07-02 NOTE — Patient Instructions (Signed)
Have PAP and mammogram report faxed to to me once completed Go to lab Continue Heart healthy diet and daily exercise. Maintain current medications.  Preventive Care 44-44 Years Old, Female Preventive care refers to lifestyle choices and visits with your health care provider that can promote health and wellness. Preventive care visits are also called wellness exams. What can I expect for my preventive care visit? Counseling Your health care provider may ask you questions about your: Medical history, including: Past medical problems. Family medical history. Pregnancy history. Current health, including: Menstrual cycle. Method of birth control. Emotional well-being. Home life and relationship well-being. Sexual activity and sexual health. Lifestyle, including: Alcohol, nicotine or tobacco, and drug use. Access to firearms. Diet, exercise, and sleep habits. Work and work Astronomer. Sunscreen use. Safety issues such as seatbelt and bike helmet use. Physical exam Your health care provider will check your: Height and weight. These may be used to calculate your BMI (body mass index). BMI is a measurement that tells if you are at a healthy weight. Waist circumference. This measures the distance around your waistline. This measurement also tells if you are at a healthy weight and may help predict your risk of certain diseases, such as type 2 diabetes and high blood pressure. Heart rate and blood pressure. Body temperature. Skin for abnormal spots. What immunizations do I need?  Vaccines are usually given at various ages, according to a schedule. Your health care provider will recommend vaccines for you based on your age, medical history, and lifestyle or other factors, such as travel or where you work. What tests do I need? Screening Your health care provider may recommend screening tests for certain conditions. This may include: Lipid and cholesterol levels. Diabetes screening. This  is done by checking your blood sugar (glucose) after you have not eaten for a while (fasting). Pelvic exam and Pap test. Hepatitis B test. Hepatitis C test. HIV (human immunodeficiency virus) test. STI (sexually transmitted infection) testing, if you are at risk. Lung cancer screening. Colorectal cancer screening. Mammogram. Talk with your health care provider about when you should start having regular mammograms. This may depend on whether you have a family history of breast cancer. BRCA-related cancer screening. This may be done if you have a family history of breast, ovarian, tubal, or peritoneal cancers. Bone density scan. This is done to screen for osteoporosis. Talk with your health care provider about your test results, treatment options, and if necessary, the need for more tests. Follow these instructions at home: Eating and drinking  Eat a diet that includes fresh fruits and vegetables, whole grains, lean protein, and low-fat dairy products. Take vitamin and mineral supplements as recommended by your health care provider. Do not drink alcohol if: Your health care provider tells you not to drink. You are pregnant, may be pregnant, or are planning to become pregnant. If you drink alcohol: Limit how much you have to 0-1 drink a day. Know how much alcohol is in your drink. In the [place] one drink equals one 12 oz bottle of beer (355 mL), one 5 oz glass of wine (148 mL), or one 1 oz glass of hard liquor (44 mL). Lifestyle Brush your teeth every morning and night with fluoride toothpaste. Floss one time each day. Exercise for at least 30 minutes 5 or more days each week. Do not use any products that contain nicotine or tobacco. These products include cigarettes, chewing tobacco, and vaping devices, such as e-cigarettes. If you need help quitting, ask  your health care provider. Do not use drugs. If you are sexually active, practice safe sex. Use a condom or other form of protection to  prevent STIs. If you do not wish to become pregnant, use a form of birth control. If you plan to become pregnant, see your health care provider for a prepregnancy visit. Take aspirin only as told by your health care provider. Make sure that you understand how much to take and what form to take. Work with your health care provider to find out whether it is safe and beneficial for you to take aspirin daily. Find healthy ways to manage stress, such as: Meditation, yoga, or listening to music. Journaling. Talking to a trusted person. Spending time with friends and family. Minimize exposure to UV radiation to reduce your risk of skin cancer. Safety Always wear your seat belt while driving or riding in a vehicle. Do not drive: If you have been drinking alcohol. Do not ride with someone who has been drinking. When you are tired or distracted. While texting. If you have been using any mind-altering substances or drugs. Wear a helmet and other protective equipment during sports activities. If you have firearms in your house, make sure you follow all gun safety procedures. Seek help if you have been physically or sexually abused. What's next? Visit your health care provider once a year for an annual wellness visit. Ask your health care provider how often you should have your eyes and teeth checked. Stay up to date on all vaccines. This information is not intended to replace advice given to you by your health care provider. Make sure you discuss any questions you have with your health care provider. Document Revised: 08/02/2020 Document Reviewed: 08/02/2020 Elsevier Patient Education  2023 [employer].

## 2022-07-02 NOTE — Assessment & Plan Note (Signed)
Repeat hgbA1c 

## 2022-07-02 NOTE — Assessment & Plan Note (Signed)
Secondary to OA, worse with prolonged walking or standing. X-ray completed 02/2018: Mild to moderate tricompartment degenerative changes. No improvement with OTC NSAIDs and tylenol. She is also under the care of chiropractor who provided home knee exercises.  Advised to continue home exercise, use recumbent bicycle for cardio exercise Sent mobic 15mg 

## 2022-07-02 NOTE — Progress Notes (Addendum)
Complete physical exam  Patient: Rebecca Irwin   DOB: January 23, 1981   42 y.o. Female  MRN: [id number] Visit Date: 07/02/2022  Subjective:    Chief Complaint  Patient presents with   Annual Exam    Fasting- yes -- Next pap due in August    Rebecca Irwin is a 42 y.o. female who presents today for a complete physical exam. She reports consuming a general diet.  walking  She generally feels well. She reports sleeping well. She does have additional problems to discuss today.  Vision:Yes Dental:No STD Screen:No  BP Readings from Last 3 Encounters:  07/02/22 120/84  04/24/22 115/77  12/24/21 (!) 140/97   Wt Readings from Last 3 Encounters:  07/02/22 (!) 323 lb (146.5 kg)  04/24/22 (!) 322 lb (146.1 kg)  12/24/21 (!) 328 lb 9.6 oz (149.1 kg)   Most recent fall risk assessment:    07/02/2022    8:30 AM  Fall Risk   Falls in the past year? 0  Number falls in past yr: 0  Injury with Fall? 0  Risk for fall due to : No Fall Risks  Follow up Falls evaluation completed   Depression screen:Yes - No Depression Most recent depression screenings:    07/02/2022    8:30 AM 03/07/2021    9:25 AM  PHQ 2/9 Scores  PHQ - 2 Score 0 0  PHQ- 9 Score  2   HPI  Chronic pain of left knee Secondary to OA, worse with prolonged walking or standing. X-ray completed 02/2018: Mild to moderate tricompartment degenerative changes. No improvement with OTC NSAIDs and tylenol. She is also under the care of chiropractor who provided home knee exercises.  Advised to continue home exercise, use recumbent bicycle for cardio exercise Sent mobic 15mg   Hyperglycemia Repeat hgbA1c  Mixed hyperlipidemia Repeat lipid panel Advised to maintain DASH diet and daily exercis Repeat in 6months   Past Medical History:  Diagnosis Date   ALLERGIC RHINITIS    skin test positive 2004   Asthma    Thrombophlebitis of saphenous vein, left 03/07/2021   Past Surgical History:  Procedure  Laterality Date   LITHOTRIPSY     kidney stones   WISDOM TOOTH EXTRACTION     Social History   Socioeconomic History   Marital status: Single    Spouse name: Not on file   Number of children: Not on file   Years of education: Not on file   Highest education level: Not on file  Occupational History   Occupation: works with IT    Employer: [employer]  Tobacco Use   Smoking status: Never    Passive exposure: Never   Smokeless tobacco: Never  Vaping Use   Vaping Use: Never used  Substance and Sexual Activity   Alcohol use: Yes    Comment: occass.   Drug use: Never   Sexual activity: Not Currently    Birth control/protection: Pill  Other Topics Concern   Not on file  Social History Narrative   Not on file   Social Determinants of Health   Financial Resource Strain: Not on file  Food Insecurity: Not on file  Transportation Needs: Not on file  Physical Activity: Not on file  Stress: Not on file  Social Connections: Not on file  Intimate Partner Violence: Not on file   Family Status  Relation Name Status   Father  (Not Specified)   MGM  (Not Specified)   MGF  (Not  Specified)   PGM  (Not Specified)   PGF  (Not Specified)   Family History  Problem Relation Age of Onset   Allergies Father        on allergy shots   Diabetes Maternal Grandmother    Diabetes Maternal Grandfather    Pancreatic cancer Maternal Grandfather    Breast cancer Paternal Grandmother 37   Lung cancer Paternal Grandfather    No Known Allergies  Patient Care Team: Ziare Cryder, Bonna Gains, NP as PCP - General (Internal Medicine) Carrington Clamp, MD as Consulting Physician (Obstetrics and Gynecology)   Medications: Outpatient Medications Prior to Visit  Medication Sig   chlorthalidone (HYGROTON) 25 MG tablet TAKE 1/2 TABLET(12.5 MG) BY MOUTH DAILY   MAGNESIUM CITRATE PO Take 1 tablet by mouth daily.   valsartan (DIOVAN) 40 MG tablet TAKE 1 TABLET(40 MG) BY MOUTH DAILY.    [DISCONTINUED] meloxicam (MOBIC) 15 MG tablet TAKE 1 TABLET(15 MG) BY MOUTH DAILY WITH FOOD   No facility-administered medications prior to visit.    Review of Systems  Constitutional:  Negative for activity change, appetite change and unexpected weight change.  Respiratory: Negative.    Cardiovascular: Negative.   Gastrointestinal: Negative.   Endocrine: Negative for cold intolerance and heat intolerance.  Genitourinary: Negative.   Musculoskeletal:  Positive for arthralgias. Negative for gait problem and joint swelling.  Skin: Negative.   Neurological: Negative.   Hematological: Negative.   Psychiatric/Behavioral:  Negative for behavioral problems, decreased concentration, dysphoric mood, hallucinations, self-injury, sleep disturbance and suicidal ideas. The patient is not nervous/anxious.         Objective:  BP 120/84 (BP Location: Right Arm, Patient Position: Sitting, Cuff Size: Large)   Pulse 70   Temp 98.4 F (36.9 C) (Temporal)   Resp 16   Ht 5\' 9"  (1.753 m)   Wt (!) 323 lb (146.5 kg)   LMP 06/10/2022 (Exact Date) Comment: mirena IUD inserted 10/2021  SpO2 94%   BMI 47.70 kg/m     Physical Exam Vitals and nursing note reviewed.  Constitutional:      General: She is not in acute distress. HENT:     Right Ear: Tympanic membrane, ear canal and external ear normal.     Left Ear: Tympanic membrane, ear canal and external ear normal.     Nose: Nose normal.  Eyes:     Extraocular Movements: Extraocular movements intact.     Conjunctiva/sclera: Conjunctivae normal.     Pupils: Pupils are equal, round, and reactive to light.  Neck:     Thyroid: No thyroid mass, thyromegaly or thyroid tenderness.  Cardiovascular:     Rate and Rhythm: Normal rate and regular rhythm.     Pulses: Normal pulses.     Heart sounds: Normal heart sounds.  Pulmonary:     Effort: Pulmonary effort is normal.     Breath sounds: Normal breath sounds.  Abdominal:     General: Bowel sounds are  normal.     Palpations: Abdomen is soft.  Genitourinary:    Comments: Breast and pelvic exam deferred to GYN Musculoskeletal:        General: Normal range of motion.     Cervical back: Normal range of motion and neck supple.     Right hip: Normal.     Left hip: Normal.     Right upper leg: Normal.     Left upper leg: Normal.     Right knee: Normal.     Left knee: Crepitus present. No  bony tenderness. Normal range of motion. Tenderness present over the patellar tendon. No medial joint line or lateral joint line tenderness.     Right lower leg: Normal. No edema.     Left lower leg: Normal. No edema.     Right ankle: Normal.     Left ankle: Normal.  Lymphadenopathy:     Cervical: No cervical adenopathy.  Skin:    General: Skin is warm and dry.  Neurological:     Mental Status: She is alert and oriented to person, place, and time.     Cranial Nerves: No cranial nerve deficit.  Psychiatric:        Mood and Affect: Mood normal.        Behavior: Behavior normal.        Thought Content: Thought content normal.     No results found for any visits on 07/02/22.    Assessment & Plan:    Routine Health Maintenance and Physical Exam  Immunization History  Administered Date(s) Administered   Influenza,inj,Quad PF,6+ Mos 11/25/2018   Influenza-Unspecified 11/25/2019, 10/19/2020   PFIZER(Purple Top)SARS-COV-2 Vaccination 04/24/2019, 05/15/2019, 12/09/2019, 01/17/2021   PNEUMOCOCCAL CONJUGATE-20 03/07/2021   Tdap 03/07/2021   Health Maintenance  Topic Date Due   COVID-19 Vaccine (5 - 2023-24 season) 10/19/2021   Hepatitis C Screening  07/02/2023 (Originally 06/02/1996)   HIV Screening  07/02/2023 (Originally 06/02/1993)   INFLUENZA VACCINE  09/19/2022   PAP SMEAR-Modifier  10/10/2024   DTaP/Tdap/Td (2 - Td or Tdap) 03/08/2031   HPV VACCINES  Aged Out   Discussed health benefits of physical activity, and encouraged her to engage in regular exercise appropriate for her age and  condition.  Problem List Items Addressed This Visit       Other   Chronic pain of left knee    Secondary to OA, worse with prolonged walking or standing. X-ray completed 02/2018: Mild to moderate tricompartment degenerative changes. No improvement with OTC NSAIDs and tylenol. She is also under the care of chiropractor who provided home knee exercises.  Advised to continue home exercise, use recumbent bicycle for cardio exercise Sent mobic 15mg       Relevant Medications   meloxicam (MOBIC) 15 MG tablet   Hyperglycemia    Repeat hgbA1c      Relevant Orders   Hemoglobin A1c   Mixed hyperlipidemia    Repeat lipid panel Advised to maintain DASH diet and daily exercis Repeat in 6months       Relevant Orders   Lipid panel   Morbid obesity (HCC)   Other Visit Diagnoses     Encounter for preventative adult health care exam with abnormal findings    -  Primary   Relevant Orders   Comprehensive metabolic panel      Return in about 6 months (around 01/02/2023) for HTN, hyperlipidemia, prediabetes (fasting).     Alysia Penna, NP

## 2022-07-02 NOTE — Assessment & Plan Note (Signed)
Repeat lipid panel Advised to maintain DASH diet and daily exercis Repeat in 6months

## 2022-09-25 ENCOUNTER — Encounter: Payer: Self-pay | Admitting: Nurse Practitioner

## 2022-09-25 LAB — HM MAMMOGRAPHY

## 2022-10-15 ENCOUNTER — Ambulatory Visit: Payer: BC Managed Care – PPO | Admitting: Nurse Practitioner

## 2022-10-15 ENCOUNTER — Encounter: Payer: Self-pay | Admitting: Nurse Practitioner

## 2022-10-15 VITALS — BP 111/80 | HR 65 | Temp 97.6°F | Resp 16 | Ht 69.0 in | Wt 317.2 lb

## 2022-10-15 DIAGNOSIS — G8929 Other chronic pain: Secondary | ICD-10-CM

## 2022-10-15 DIAGNOSIS — M25562 Pain in left knee: Secondary | ICD-10-CM | POA: Diagnosis not present

## 2022-10-15 NOTE — Progress Notes (Signed)
                Established Patient Visit  Patient: Rebecca Irwin   DOB: Jun 20, 1980   42 y.o. Female  MRN: [id number] Visit Date: 10/15/2022  Subjective:    Chief Complaint  Patient presents with   Knee Pain    Left knee pain - The last couple of weeks my knee has gotten way worse    Chronic pain of left knee Worsening knee pain and swelling x 3weeks. Minimal improvement with ibuprofen or mobic. Denies any injury. DG knee completed 2020: Mild to moderate tricompartment degenerative changes on the left. Small joint effusion. No acute bony abnormality.  Entered sports medicine referral Has appointment tomorrow with Dr. Denyse Amass   Reviewed medical, surgical, and social history today  Medications: Outpatient Medications Prior to Visit  Medication Sig   chlorthalidone (HYGROTON) 25 MG tablet TAKE 1/2 TABLET(12.5 MG) BY MOUTH DAILY   MAGNESIUM CITRATE PO Take 1 tablet by mouth daily.   meloxicam (MOBIC) 15 MG tablet Take 1 tablet (15 mg total) by mouth daily as needed for pain.   valsartan (DIOVAN) 40 MG tablet TAKE 1 TABLET(40 MG) BY MOUTH DAILY.   No facility-administered medications prior to visit.   Reviewed past medical and social history.   ROS per HPI above      Objective:  BP 111/80 (BP Location: Left Arm, Patient Position: Sitting, Cuff Size: Large)   Pulse 65   Temp 97.6 F (36.4 C) (Temporal)   Resp 16   Ht 5\' 9"  (1.753 m)   Wt (!) 317 lb 3.2 oz (143.9 kg)   SpO2 97%   BMI 46.84 kg/m      Physical Exam Vitals and nursing note reviewed.  Cardiovascular:     Rate and Rhythm: Normal rate.     Pulses: Normal pulses.  Pulmonary:     Effort: Pulmonary effort is normal.  Musculoskeletal:     Left knee: Swelling and effusion present. No erythema. Normal range of motion. No tenderness. No medial joint line, lateral joint line or patellar tendon tenderness.     Comments: Limping gait.  Neurological:     Mental Status: She is alert.     No results  found for any visits on 10/15/22.    Assessment & Plan:    Problem List Items Addressed This Visit     Chronic pain of left knee - Primary    Worsening knee pain and swelling x 3weeks. Minimal improvement with ibuprofen or mobic. Denies any injury. DG knee completed 2020: Mild to moderate tricompartment degenerative changes on the left. Small joint effusion. No acute bony abnormality.  Entered sports medicine referral Has appointment tomorrow with Dr. Denyse Amass      Relevant Orders   Ambulatory referral to Sports Medicine   Return if symptoms worsen or fail to improve.     Alysia Penna, NP

## 2022-10-15 NOTE — Assessment & Plan Note (Addendum)
Worsening knee pain and swelling x 3weeks. Minimal improvement with ibuprofen or mobic. Denies any injury. DG knee completed 2020: Mild to moderate tricompartment degenerative changes on the left. Small joint effusion. No acute bony abnormality.  Entered sports medicine referral Has appointment tomorrow with Dr. Denyse Amass

## 2022-10-16 ENCOUNTER — Other Ambulatory Visit: Payer: Self-pay

## 2022-10-16 ENCOUNTER — Ambulatory Visit: Payer: BC Managed Care – PPO | Admitting: Family Medicine

## 2022-10-16 ENCOUNTER — Ambulatory Visit (INDEPENDENT_AMBULATORY_CARE_PROVIDER_SITE_OTHER): Payer: BC Managed Care – PPO

## 2022-10-16 ENCOUNTER — Encounter: Payer: Self-pay | Admitting: Family Medicine

## 2022-10-16 VITALS — BP 122/82 | HR 79 | Ht 69.0 in | Wt 315.0 lb

## 2022-10-16 DIAGNOSIS — M25562 Pain in left knee: Secondary | ICD-10-CM

## 2022-10-16 DIAGNOSIS — G8929 Other chronic pain: Secondary | ICD-10-CM

## 2022-10-16 NOTE — Patient Instructions (Addendum)
 Thank you for coming in today.   You received an injection today. Seek immediate medical attention if the joint becomes red, extremely painful, or is oozing fluid.   Please get an Xray today before you leave   Please use Voltaren gel (Generic Diclofenac Gel) up to 4x daily for pain as needed.  This is available over-the-counter as both the name brand Voltaren gel and the generic diclofenac gel.   Check back as needed

## 2022-10-16 NOTE — Progress Notes (Unsigned)
I, Stevenson Clinch, CMA acting as a scribe for Rebecca Graham, MD.  Rebecca Irwin is a 42 y.o. female who presents to [facility] at [facility] today for L knee pain worsening over the last 3 wks. Pt locates pain to lateral and posterior aspects. Has been diagnosed with arthritis. Sx worsen throughout the day. Hx of multiple injuries to the left knee (sprains and strains). Ambulating with a limp. Sx have responded well to Mobic in the past, but not with this occurrence of pain.   L Knee swelling: yes Mechanical symptoms: yes Aggravates: flexion, sit-to-stand, end of day Treatments tried: Mobic, ice, rest  Pertinent review of systems: No fevers or chills  Relevant historical information: Hypertension.  Obesity.  History of superficial thrombophlebitis left leg.   Exam:  BP 122/82   Pulse 79   Ht 5\' 9"  (1.753 m)   Wt (!) 315 lb (142.9 kg)   SpO2 95%   BMI 46.52 kg/m  General: Well Developed, well nourished, and in no acute distress.   MSK: Left knee mild effusion.  Normal motion with crepitation. Mildly tender palpation medial and lateral knee. Stable ligamentous exam. Intact strength.    Lab and Radiology Results  Procedure: Real-time Ultrasound Guided Injection of left knee joint superior lateral patella space Device: Philips Affiniti 50G/GE Logiq Images permanently stored and available for review in PACS Verbal informed consent obtained.  Discussed risks and benefits of procedure. Warned about infection, bleeding, hyperglycemia damage to structures among others. Patient expresses understanding and agreement Time-out conducted.   Noted no overlying erythema, induration, or other signs of local infection.   Skin prepped in a sterile fashion.   Local anesthesia: Topical Ethyl chloride.   With sterile technique and under real time ultrasound guidance: 40 mg of Kenalog and 2 mL of Marcaine injected into knee joint. Fluid seen entering the joint  capsule.   Completed without difficulty   Pain immediately resolved suggesting accurate placement of the medication.   Advised to call if fevers/chills, erythema, induration, drainage, or persistent bleeding.   Images permanently stored and available for review in the ultrasound unit.  Impression: Technically successful ultrasound guided injection.   X-ray images left knee obtained today personally and independently interpreted Mild to moderate medial compartment DJD.  Mild patellofemoral DJD.  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 Await formal radiology review     Assessment and Plan: 42 y.o. female with left knee pain thought to be exacerbation of DJD.  Plan for steroid injection today.  If not improved consider hyaluronic acid injections or Zilretta injections or even advanced imaging such as MRI.   PDMP not reviewed this encounter. Orders Placed This Encounter  Procedures   [place] LIMITED JOINT SPACE STRUCTURES LOW LEFT(NO LINKED CHARGES)    Order Specific Question:   Reason for Exam (SYMPTOM  OR DIAGNOSIS REQUIRED)    Answer:   left knee pain    Order Specific Question:   Preferred imaging location?    Answer:    [facility] Knee AP/LAT W/Sun[place] Left    Standing Status:   Future    Number of Occurrences:   1    Standing Expiration Date:   11/16/2022    Order Specific Question:   Reason for Exam (SYMPTOM  OR DIAGNOSIS REQUIRED)    Answer:   left knee pain    Order Specific Question:   Preferred imaging location?  Answer:   [facility]    Order Specific Question:   Is patient pregnant?    Answer:   No   No orders of the defined types were placed in this encounter.    Discussed warning signs or symptoms. Please see discharge instructions. Patient expresses understanding.   The above documentation has been reviewed and is accurate and complete Rebecca Irwin, M.D.

## 2022-10-22 NOTE — Progress Notes (Signed)
Left knee x-ray shows arthritis severe on the inside portion of the knee.

## 2023-01-25 ENCOUNTER — Other Ambulatory Visit: Payer: Self-pay | Admitting: Nurse Practitioner

## 2023-01-25 DIAGNOSIS — G8929 Other chronic pain: Secondary | ICD-10-CM

## 2023-01-28 ENCOUNTER — Other Ambulatory Visit: Payer: Self-pay | Admitting: Nurse Practitioner

## 2023-01-28 DIAGNOSIS — I10 Essential (primary) hypertension: Secondary | ICD-10-CM

## 2023-04-28 ENCOUNTER — Other Ambulatory Visit: Payer: Self-pay | Admitting: Nurse Practitioner

## 2023-04-28 DIAGNOSIS — I10 Essential (primary) hypertension: Secondary | ICD-10-CM

## 2023-05-02 ENCOUNTER — Encounter: Payer: Self-pay | Admitting: Nurse Practitioner

## 2023-05-02 DIAGNOSIS — I10 Essential (primary) hypertension: Secondary | ICD-10-CM

## 2023-05-05 MED ORDER — CHLORTHALIDONE 25 MG PO TABS
12.5000 mg | ORAL_TABLET | Freq: Every day | ORAL | 0 refills | Status: DC
Start: 2023-05-05 — End: 2023-06-04

## 2023-05-21 ENCOUNTER — Encounter: Payer: Self-pay | Admitting: Nurse Practitioner

## 2023-05-21 DIAGNOSIS — I10 Essential (primary) hypertension: Secondary | ICD-10-CM

## 2023-05-26 MED ORDER — VALSARTAN 40 MG PO TABS
40.0000 mg | ORAL_TABLET | Freq: Every day | ORAL | 0 refills | Status: DC
Start: 2023-05-26 — End: 2023-06-04

## 2023-05-26 NOTE — Telephone Encounter (Signed)
 Medication: Valsartan (Diovan) 40 mg  Directions: Take 1 tablet by mouth daily  Last given: 05/03/22 Number refills: 3 Last o/v: 10/15/22 (Acute), 07/02/22 Follow up: 6 months (around 01/02/23)  Labs: 07/02/22

## 2023-06-04 ENCOUNTER — Ambulatory Visit: Payer: Self-pay | Admitting: Nurse Practitioner

## 2023-06-04 ENCOUNTER — Encounter: Payer: Self-pay | Admitting: Nurse Practitioner

## 2023-06-04 ENCOUNTER — Telehealth: Payer: Self-pay

## 2023-06-04 VITALS — BP 126/82 | HR 73 | Temp 98.1°F | Ht 69.0 in | Wt 324.2 lb

## 2023-06-04 DIAGNOSIS — T502X5A Adverse effect of carbonic-anhydrase inhibitors, benzothiadiazides and other diuretics, initial encounter: Secondary | ICD-10-CM | POA: Diagnosis not present

## 2023-06-04 DIAGNOSIS — E876 Hypokalemia: Secondary | ICD-10-CM | POA: Diagnosis not present

## 2023-06-04 DIAGNOSIS — R739 Hyperglycemia, unspecified: Secondary | ICD-10-CM | POA: Diagnosis not present

## 2023-06-04 DIAGNOSIS — I10 Essential (primary) hypertension: Secondary | ICD-10-CM | POA: Diagnosis not present

## 2023-06-04 DIAGNOSIS — E782 Mixed hyperlipidemia: Secondary | ICD-10-CM | POA: Diagnosis not present

## 2023-06-04 DIAGNOSIS — Z6841 Body Mass Index (BMI) 40.0 and over, adult: Secondary | ICD-10-CM

## 2023-06-04 DIAGNOSIS — M25562 Pain in left knee: Secondary | ICD-10-CM

## 2023-06-04 DIAGNOSIS — G8929 Other chronic pain: Secondary | ICD-10-CM

## 2023-06-04 LAB — LIPID PANEL
Cholesterol: 216 mg/dL — ABNORMAL HIGH (ref 0–200)
HDL: 58.1 mg/dL (ref >39.00)
LDL Cholesterol: 135 mg/dL — ABNORMAL HIGH (ref 0–99)
NonHDL: 158.22
Total CHOL/HDL Ratio: 4
Triglycerides: 118 mg/dL (ref 0.0–149.0)
VLDL: 23.6 mg/dL (ref 0.0–40.0)

## 2023-06-04 LAB — BASIC METABOLIC PANEL WITH GFR
BUN: 17 mg/dL (ref 6–23)
CO2: 30 meq/L (ref 19–32)
Calcium: 9.5 mg/dL (ref 8.4–10.5)
Chloride: 99 meq/L (ref 96–112)
Creatinine, Ser: 0.85 mg/dL (ref 0.40–1.20)
GFR: 82.98 mL/min (ref >60.00)
Glucose, Bld: 121 mg/dL — ABNORMAL HIGH (ref 70–99)
Potassium: 3.6 meq/L (ref 3.5–5.1)
Sodium: 138 meq/L (ref 135–145)

## 2023-06-04 LAB — HEMOGLOBIN A1C: Hgb A1c MFr Bld: 5.8 % (ref 4.6–6.5)

## 2023-06-04 MED ORDER — MELOXICAM 15 MG PO TABS: 15.0000 mg | ORAL_TABLET | Freq: Every day | ORAL | 1 refills | Status: DC

## 2023-06-04 MED ORDER — CHLORTHALIDONE 25 MG PO TABS: 12.5000 mg | ORAL_TABLET | Freq: Every day | ORAL | 0 refills | Status: DC

## 2023-06-04 MED ORDER — VALSARTAN 40 MG PO TABS: 40.0000 mg | ORAL_TABLET | Freq: Every day | ORAL | 3 refills | Status: AC

## 2023-06-04 NOTE — Assessment & Plan Note (Signed)
 -  Repeat BMP

## 2023-06-04 NOTE — Patient Instructions (Addendum)
 Ask insurance if wegovy or zepbound or Fredderick Jacquet is covered? Go to lab Maintain Heart healthy diet and daily exercise. Maintain current medications.

## 2023-06-04 NOTE — Telephone Encounter (Signed)
 efax sent 06/04/23 to [facility] requesting pt PAP records. Report received stated pt had screening mammo completed, but no PAP was done. Report in Media tb for review (see page 10).

## 2023-06-04 NOTE — Assessment & Plan Note (Addendum)
 Decreased exercise and eating out due to work schedule Wt Readings from Last 3 Encounters:  06/04/23 (!) 324 lb 3.2 oz (147.1 kg)  10/16/22 (!) 315 lb (142.9 kg)  10/15/22 (!) 317 lb 3.2 oz (143.9 kg)    Advised to inquire from her insurance about cost of contrave vs wegovy vs zepbound. Encouraged to maintain a heart healthy diet and daily exercise-low impact due to chronic knee pain

## 2023-06-04 NOTE — Progress Notes (Signed)
 Established Patient Visit  Patient: Rebecca Irwin   DOB: 1980-06-30   43 y.o. Female  MRN: [id number] Visit Date: 06/04/2023  Subjective:    Chief Complaint  Patient presents with   Medical Management of Chronic Issues    Follow up. Pt is fasting.    HPI Morbid obesity (HCC) Decreased exercise and eating out due to work schedule Wt Readings from Last 3 Encounters:  06/04/23 (!) 324 lb 3.2 oz (147.1 kg)  10/16/22 (!) 315 lb (142.9 kg)  10/15/22 (!) 317 lb 3.2 oz (143.9 kg)    Advised to inquire from her insurance about cost of contrave vs wegovy vs zepbound. Encouraged to maintain a heart healthy diet and daily exercise-low impact due to chronic knee pain  Mixed hyperlipidemia Repeat lipid panel Advised to maintain mediterranean diet and daily exercis Repeat in 6months   Hyperglycemia Repeat hgbA1c  Diuretic-induced hypokalemia Repeat BMP  Reviewed medical, surgical, and social history today  Medications: Outpatient Medications Prior to Visit  Medication Sig   MAGNESIUM CITRATE PO Take 1 tablet by mouth daily.   [DISCONTINUED] chlorthalidone (HYGROTON) 25 MG tablet Take 0.5 tablets (12.5 mg total) by mouth daily.   [DISCONTINUED] meloxicam (MOBIC) 15 MG tablet TAKE 1 TABLET(15 MG) BY MOUTH DAILY AS NEEDED FOR PAIN   [DISCONTINUED] valsartan (DIOVAN) 40 MG tablet Take 1 tablet (40 mg total) by mouth daily for 9 days.   No facility-administered medications prior to visit.   Reviewed past medical and social history.   ROS per HPI above      Objective:  BP 126/82 (BP Location: Right Arm, Patient Position: Sitting, Cuff Size: Large)   Pulse 73   Temp 98.1 F (36.7 C) (Temporal)   Ht 5\' 9"  (1.753 m)   Wt (!) 324 lb 3.2 oz (147.1 kg)   SpO2 94%   BMI 47.88 kg/m      Physical Exam Vitals and nursing note reviewed.  Constitutional:      Appearance: She is obese.  Cardiovascular:     Rate and Rhythm: Normal rate and regular rhythm.      Pulses: Normal pulses.     Heart sounds: Normal heart sounds.  Pulmonary:     Effort: Pulmonary effort is normal.     Breath sounds: Normal breath sounds.  Musculoskeletal:     Right lower leg: No edema.     Left lower leg: No edema.  Neurological:     Mental Status: She is alert and oriented to person, place, and time.     No results found for any visits on 06/04/23.    Assessment & Plan:    Problem List Items Addressed This Visit     Chronic pain of left knee   Relevant Medications   meloxicam (MOBIC) 15 MG tablet   Diuretic-induced hypokalemia   Repeat BMP      Relevant Orders   Basic metabolic panel with GFR   Essential hypertension - Primary   Relevant Medications   valsartan (DIOVAN) 40 MG tablet   chlorthalidone (HYGROTON) 25 MG tablet (Start on 08/04/2023)   Other Relevant Orders   Basic metabolic panel with GFR   Hyperglycemia   Repeat hgbA1c      Relevant Orders   Hemoglobin A1c   Mixed hyperlipidemia   Repeat lipid panel Advised to maintain mediterranean diet and daily exercis Repeat in 6months       Relevant Medications  valsartan (DIOVAN) 40 MG tablet   chlorthalidone (HYGROTON) 25 MG tablet (Start on 08/04/2023)   Other Relevant Orders   Lipid panel   Morbid obesity (HCC)   Decreased exercise and eating out due to work schedule Wt Readings from Last 3 Encounters:  06/04/23 (!) 324 lb 3.2 oz (147.1 kg)  10/16/22 (!) 315 lb (142.9 kg)  10/15/22 (!) 317 lb 3.2 oz (143.9 kg)    Advised to inquire from her insurance about cost of contrave vs wegovy vs zepbound. Encouraged to maintain a heart healthy diet and daily exercise-low impact due to chronic knee pain      Return in about 6 months (around 12/04/2023) for CPE (fasting).     Kathrene Parents, NP

## 2023-06-04 NOTE — Assessment & Plan Note (Signed)
 Repeat hgbA1c

## 2023-06-04 NOTE — Assessment & Plan Note (Signed)
 Repeat lipid panel Advised to maintain mediterranean diet and daily exercis Repeat in 6months

## 2023-06-08 ENCOUNTER — Encounter: Payer: Self-pay | Admitting: Nurse Practitioner

## 2023-09-11 IMAGING — US US EXTREM LOW VENOUS*L*
1 series · 12 of 24 positions shown · non-contrast
Comparison: 03/02/2021
COMPARISON: 03/02/2021

Addendum:
CLINICAL DATA: Left thigh GSV/varicose vein thrombophlebitis



[Series 1: us extrem low venous*left* · 12 of 44 slices shown]
[im 2/44]
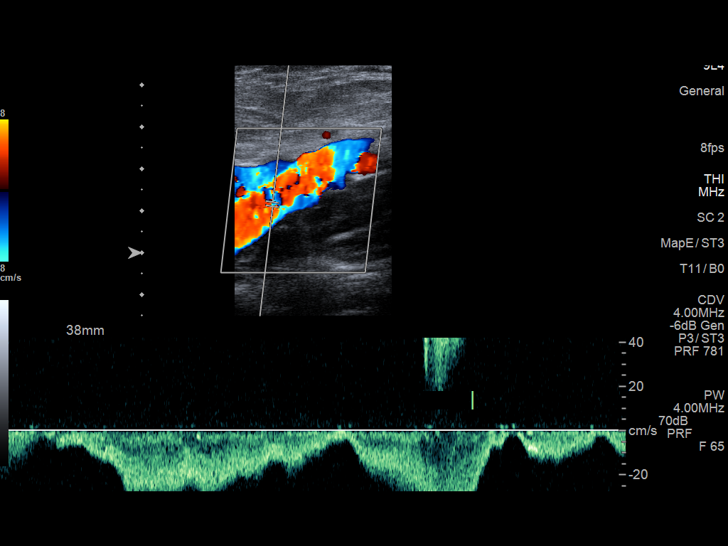
[im 6/44]
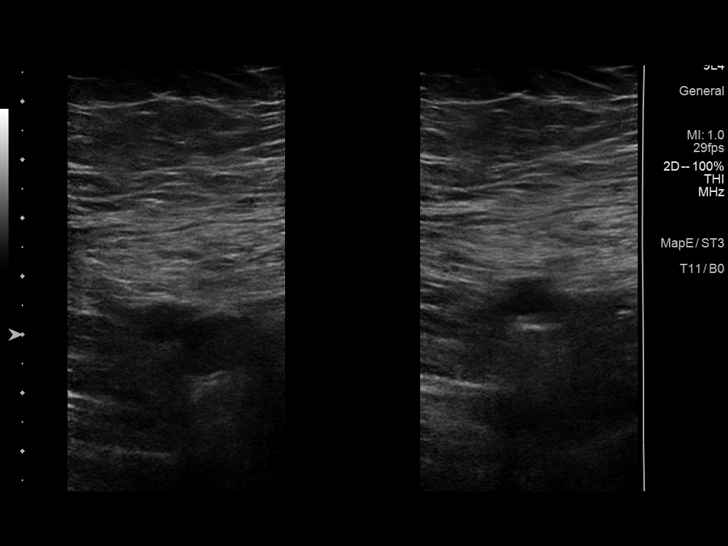
[im 10/44]
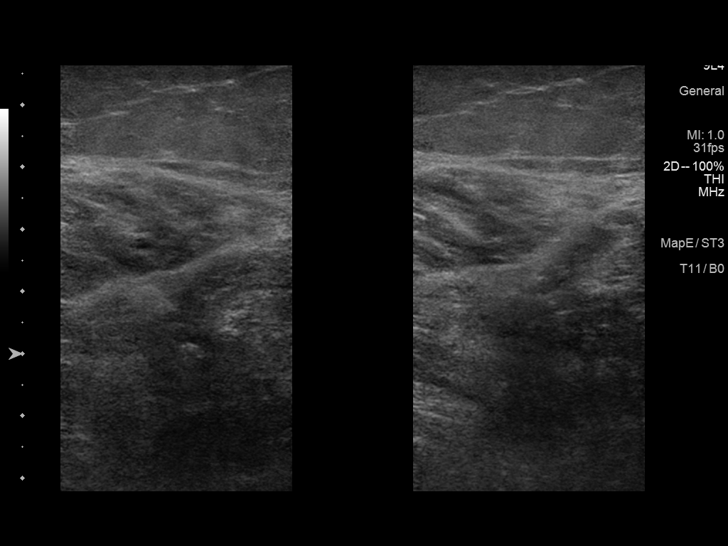
[im 14/44]
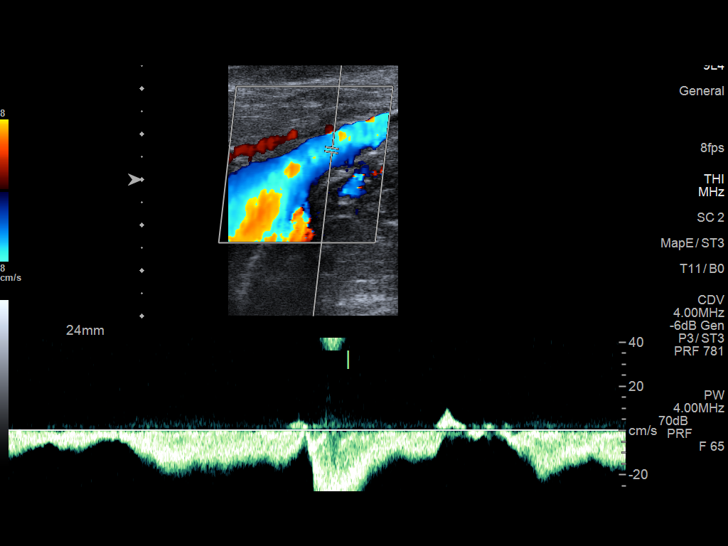
[im 17/44]
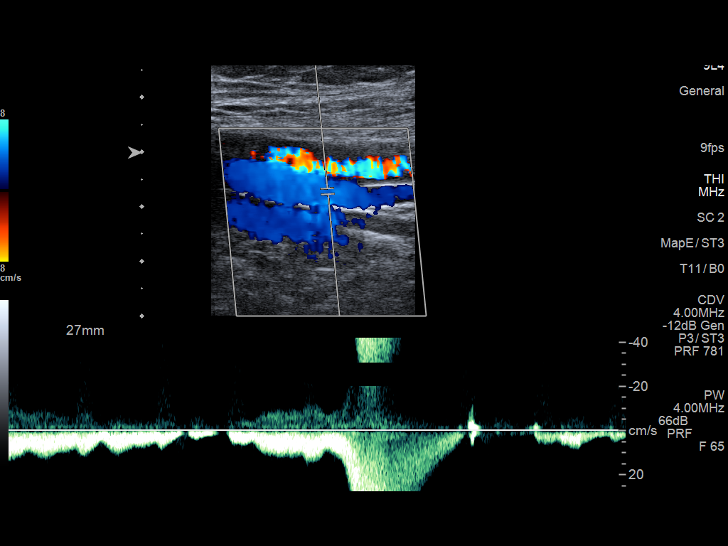
[im 21/44]
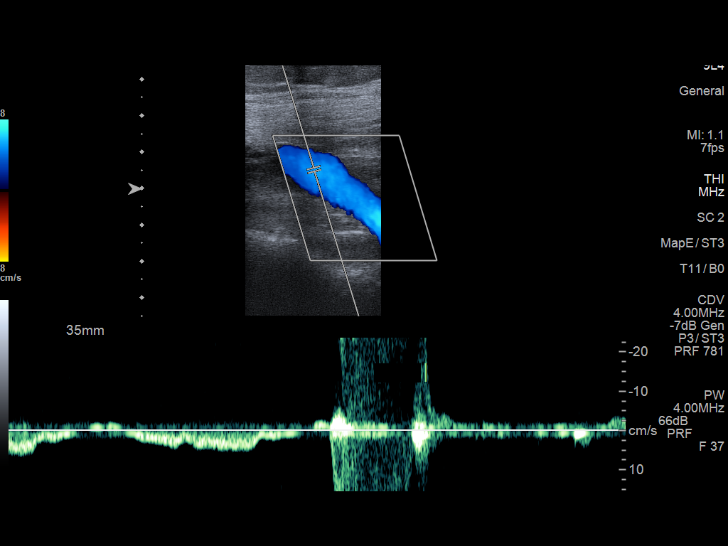
[im 25/44]
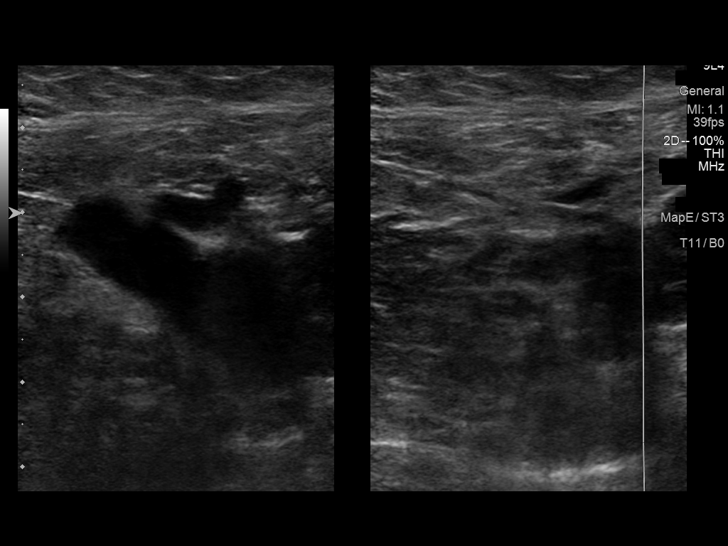
[im 29/44]
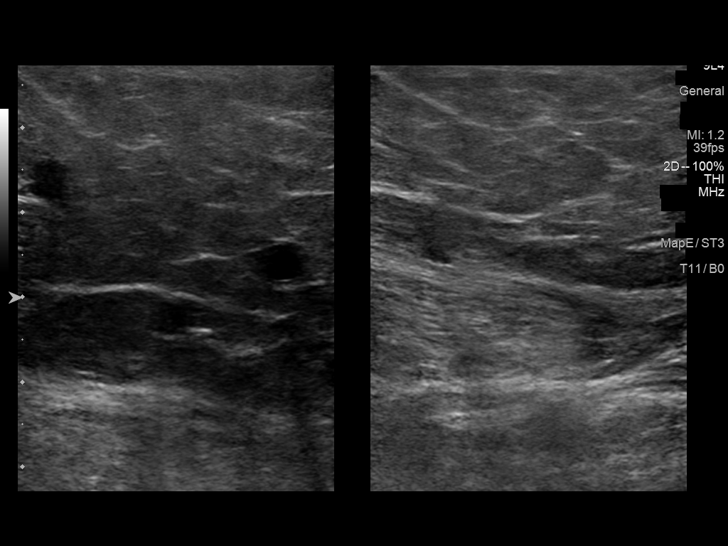
[im 32/44]
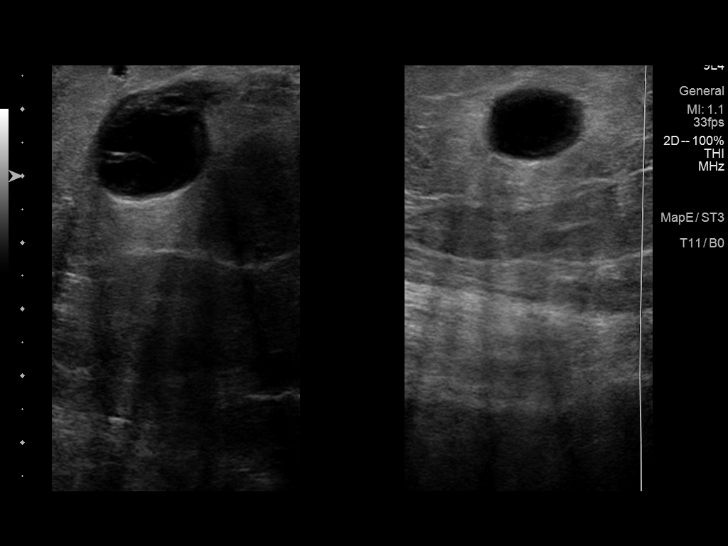
[im 36/44]
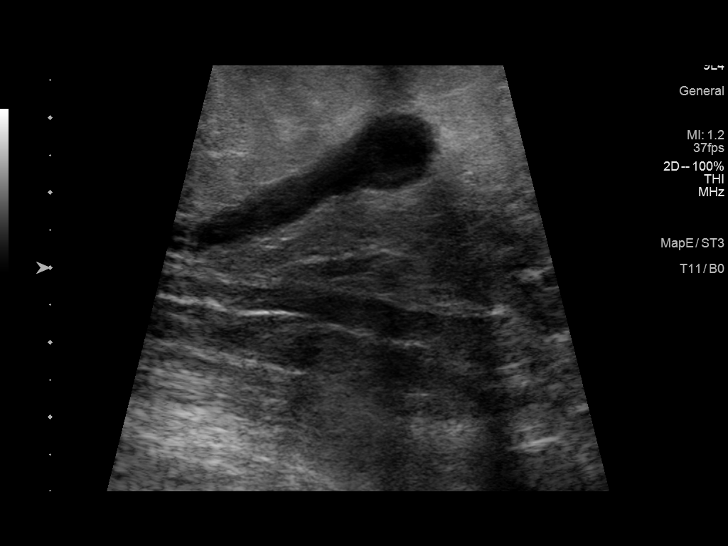
[im 40/44]
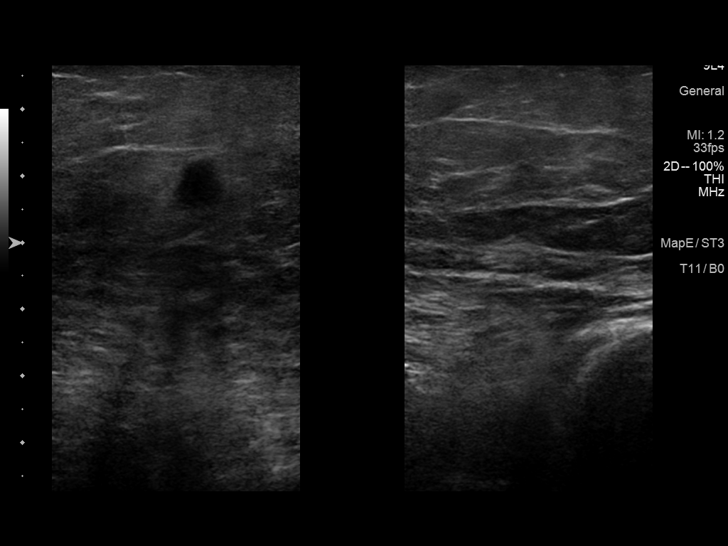
[im 44/44]
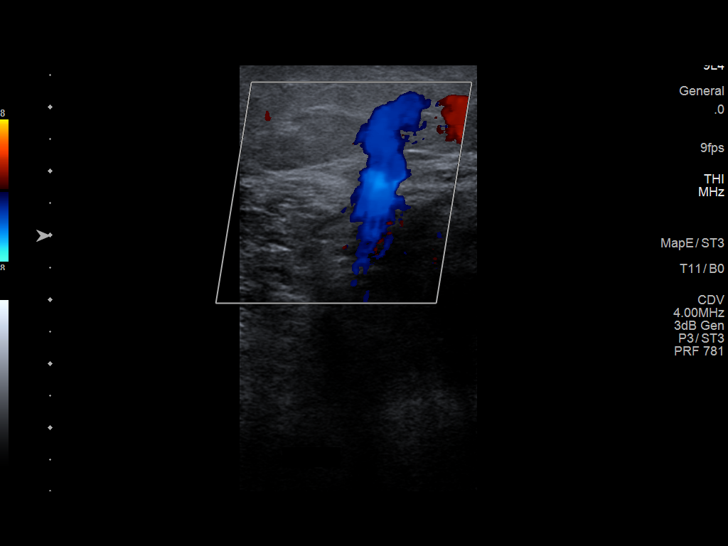

[12 of 24 positions shown; findings below may reference images not displayed]

FINDINGS: Contralateral Common Femoral Vein: Respiratory phasicity is normal
and symmetric with the symptomatic side. No evidence of thrombus.
Normal compressibility.

Common Femoral Vein: No evidence of thrombus. Normal
compressibility, respiratory phasicity and response to augmentation.

Saphenofemoral Junction: No evidence of thrombus. Normal
compressibility and flow on color Doppler imaging.

Profunda Femoral Vein: No evidence of thrombus. Normal
compressibility and flow on color Doppler imaging.

Femoral Vein: No evidence of thrombus. Normal compressibility,
respiratory phasicity and response to augmentation.

Popliteal Vein: No evidence of thrombus. Normal compressibility,
respiratory phasicity and response to augmentation.

Calf Veins: No evidence of thrombus. Normal compressibility and flow
on color Doppler imaging.

Superficial Great Saphenous Vein: Redemonstrated in the left mid
thigh area is a thrombosed dilated GSV as well as branching
thrombosed associated branching varicosities compatible with
superficial thrombosis/thrombophlebitis. Overall similar appearance
to 1 month ago.
IMPRESSION: Negative for left lower extremity DVT.

Similar appearing left mid thigh GSV and branching sub surface
varicose vein superficial thrombosis/thrombophlebitis.

ADDENDUM:
Additional
FINDINGS: Left proximal to mid thigh GSV is mildly dilated measuring 9.5 mm in
diameter. Branching sub surface varicosities remains thrombosed in
the mid to distal thigh. Appearance is similar to the prior study.

*** End of Addendum ***
FINDINGS: Contralateral Common Femoral Vein: Respiratory phasicity is normal
and symmetric with the symptomatic side. No evidence of thrombus.
Normal compressibility.

Common Femoral Vein: No evidence of thrombus. Normal
compressibility, respiratory phasicity and response to augmentation.

Saphenofemoral Junction: No evidence of thrombus. Normal
compressibility and flow on color Doppler imaging.

Profunda Femoral Vein: No evidence of thrombus. Normal
compressibility and flow on color Doppler imaging.

Femoral Vein: No evidence of thrombus. Normal compressibility,
respiratory phasicity and response to augmentation.

Popliteal Vein: No evidence of thrombus. Normal compressibility,
respiratory phasicity and response to augmentation.

Calf Veins: No evidence of thrombus. Normal compressibility and flow
on color Doppler imaging.

Superficial Great Saphenous Vein: Redemonstrated in the left mid
thigh area is a thrombosed dilated GSV as well as branching
thrombosed associated branching varicosities compatible with
superficial thrombosis/thrombophlebitis. Overall similar appearance
to 1 month ago.
IMPRESSION: Negative for left lower extremity DVT.

Similar appearing left mid thigh GSV and branching sub surface
varicose vein superficial thrombosis/thrombophlebitis.

## 2023-09-12 NOTE — Progress Notes (Signed)
   LILLETTE Ileana Collet, PhD, LAT, ATC acting as a scribe for Artist Lloyd, MD.  Rebecca Irwin is a 43 y.o. female who presents to [facility] today for exacerbation of her L knee pain. Pt was last seen by Dr. Lloyd on 10/16/22 and was given a L knee steroid injection  Today, pt reports L returned around late-April. Knee pain was exacerbated after she attended 2 conferences. She note stiffness w/ prolonged standing. She notes pain has progressively returned.   Dx imaging: 10/16/23 L knee XR  Pertinent review of systems: No fevers or chills  Relevant historical information: Hypertension obesity.   Exam:  BP 108/78   Pulse 78   Ht 5' 9 (1.753 m)   Wt (!) 334 lb (151.5 kg)   SpO2 95%   BMI 49.32 kg/m  General: Well Developed, well nourished, and in no acute distress.   MSK: Left knee minimal effusion normal-appearing otherwise normal motion.    Lab and Radiology Results  Procedure: Real-time Ultrasound Guided Injection of left knee joint superior lateral patella space Device: Philips Affiniti 50G/GE Logiq Images permanently stored and available for review in PACS Verbal informed consent obtained.  Discussed risks and benefits of procedure. Warned about infection, bleeding, hyperglycemia damage to structures among others. Patient expresses understanding and agreement Time-out conducted.   Noted no overlying erythema, induration, or other signs of local infection.   Skin prepped in a sterile fashion.   Local anesthesia: Topical Ethyl chloride.   With sterile technique and under real time ultrasound guidance: 40 mg of Kenalog and 2 mL of Marcaine injected into knee joint. Fluid seen entering the joint capsule.   Completed without difficulty   Pain immediately resolved suggesting accurate placement of the medication.   Advised to call if fevers/chills, erythema, induration, drainage, or persistent bleeding.   Images permanently stored and  available for review in the ultrasound unit.  Impression: Technically successful ultrasound guided injection.       Assessment and Plan: 43 y.o. female with left knee pain.  This is a chronic problem with an acute recurrence.  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  Plan for repeat steroid injection today.  Check back as needed.   PDMP not reviewed this encounter. Orders Placed This Encounter  Procedures   US  LIMITED JOINT SPACE STRUCTURES LOW LEFT(NO LINKED CHARGES)    Reason for Exam (SYMPTOM  OR DIAGNOSIS REQUIRED):   left knee pain    Preferred imaging location?:   [facility]   No orders of the defined types were placed in this encounter.    Discussed warning signs or symptoms. Please see discharge instructions. Patient expresses understanding.   The above documentation has been reviewed and is accurate and complete Artist Lloyd, M.D.

## 2023-09-15 ENCOUNTER — Ambulatory Visit: Admitting: Family Medicine

## 2023-09-15 ENCOUNTER — Other Ambulatory Visit: Payer: Self-pay

## 2023-09-15 VITALS — BP 108/78 | HR 78 | Ht 69.0 in | Wt 334.0 lb

## 2023-09-15 DIAGNOSIS — G8929 Other chronic pain: Secondary | ICD-10-CM

## 2023-09-15 DIAGNOSIS — M25562 Pain in left knee: Secondary | ICD-10-CM | POA: Diagnosis not present

## 2023-09-15 DIAGNOSIS — M1712 Unilateral primary osteoarthritis, left knee: Secondary | ICD-10-CM | POA: Insufficient documentation

## 2023-09-15 NOTE — Patient Instructions (Addendum)
 Thank you for coming in today.   You received an injection today. Seek immediate medical attention if the joint becomes red, extremely painful, or is oozing fluid.   Check back as needed  Reminder: Dr. Joane will be out of the office starting August 1st, for about 6 weeks

## 2023-11-01 ENCOUNTER — Other Ambulatory Visit: Payer: Self-pay | Admitting: Nurse Practitioner

## 2023-11-01 DIAGNOSIS — I10 Essential (primary) hypertension: Secondary | ICD-10-CM

## 2023-12-04 ENCOUNTER — Encounter: Admitting: Nurse Practitioner

## 2024-02-02 ENCOUNTER — Other Ambulatory Visit: Payer: Self-pay | Admitting: Nurse Practitioner

## 2024-02-02 DIAGNOSIS — G8929 Other chronic pain: Secondary | ICD-10-CM

## 2024-02-02 DIAGNOSIS — I10 Essential (primary) hypertension: Secondary | ICD-10-CM

## 2024-03-16 ENCOUNTER — Encounter: Admitting: Nurse Practitioner

## 2024-06-03 ENCOUNTER — Encounter: Admitting: Nurse Practitioner
